# Patient Record
Sex: Male | Born: 1965 | Race: Black or African American | Hispanic: No | Marital: Single | Smoking: Never smoker
Health system: Southern US, Community
[De-identification: ages and names within clinical notes are randomized; demographics above are authoritative.]

## PROBLEM LIST (undated history)

## (undated) DIAGNOSIS — G459 Transient cerebral ischemic attack, unspecified: Secondary | ICD-10-CM

## (undated) DIAGNOSIS — I10 Essential (primary) hypertension: Secondary | ICD-10-CM

---

## 2003-02-06 ENCOUNTER — Ambulatory Visit (HOSPITAL_COMMUNITY): Admission: RE | Admit: 2003-02-06 | Discharge: 2003-02-06 | Payer: Self-pay | Admitting: Internal Medicine

## 2004-08-05 ENCOUNTER — Ambulatory Visit: Payer: Self-pay | Admitting: Internal Medicine

## 2004-08-19 ENCOUNTER — Ambulatory Visit: Payer: Self-pay | Admitting: Internal Medicine

## 2004-09-07 ENCOUNTER — Ambulatory Visit: Payer: Self-pay | Admitting: Internal Medicine

## 2004-11-04 ENCOUNTER — Ambulatory Visit: Payer: Self-pay | Admitting: Internal Medicine

## 2004-11-25 ENCOUNTER — Ambulatory Visit: Payer: Self-pay | Admitting: Internal Medicine

## 2005-03-09 ENCOUNTER — Ambulatory Visit: Payer: Self-pay

## 2005-03-09 ENCOUNTER — Ambulatory Visit: Payer: Self-pay | Admitting: Internal Medicine

## 2005-03-15 ENCOUNTER — Ambulatory Visit: Payer: Self-pay | Admitting: Internal Medicine

## 2005-04-08 ENCOUNTER — Encounter: Payer: Self-pay | Admitting: Internal Medicine

## 2005-05-16 ENCOUNTER — Ambulatory Visit: Payer: Self-pay | Admitting: Internal Medicine

## 2005-05-19 ENCOUNTER — Ambulatory Visit: Payer: Self-pay | Admitting: Internal Medicine

## 2005-05-29 ENCOUNTER — Emergency Department (HOSPITAL_COMMUNITY): Admission: AD | Admit: 2005-05-29 | Discharge: 2005-05-29 | Payer: Self-pay | Admitting: Family Medicine

## 2005-06-08 ENCOUNTER — Ambulatory Visit: Payer: Self-pay | Admitting: Internal Medicine

## 2005-06-20 ENCOUNTER — Encounter: Payer: Self-pay | Admitting: Internal Medicine

## 2005-06-20 ENCOUNTER — Emergency Department (HOSPITAL_COMMUNITY): Admission: EM | Admit: 2005-06-20 | Discharge: 2005-06-20 | Payer: Self-pay | Admitting: Emergency Medicine

## 2005-06-20 ENCOUNTER — Encounter: Payer: Self-pay | Admitting: Vascular Surgery

## 2005-06-23 ENCOUNTER — Encounter: Payer: Self-pay | Admitting: Internal Medicine

## 2005-06-28 ENCOUNTER — Encounter: Payer: Self-pay | Admitting: Internal Medicine

## 2005-07-15 ENCOUNTER — Encounter (INDEPENDENT_AMBULATORY_CARE_PROVIDER_SITE_OTHER): Payer: Self-pay | Admitting: Specialist

## 2005-07-15 ENCOUNTER — Ambulatory Visit (HOSPITAL_COMMUNITY): Admission: RE | Admit: 2005-07-15 | Discharge: 2005-07-15 | Payer: Self-pay | Admitting: Specialist

## 2006-06-09 DIAGNOSIS — I10 Essential (primary) hypertension: Secondary | ICD-10-CM

## 2006-12-08 ENCOUNTER — Encounter: Payer: Self-pay | Admitting: Family Medicine

## 2006-12-08 ENCOUNTER — Ambulatory Visit: Payer: Self-pay | Admitting: Internal Medicine

## 2006-12-08 DIAGNOSIS — Z8679 Personal history of other diseases of the circulatory system: Secondary | ICD-10-CM

## 2006-12-08 DIAGNOSIS — M1990 Unspecified osteoarthritis, unspecified site: Secondary | ICD-10-CM | POA: Insufficient documentation

## 2006-12-08 DIAGNOSIS — F528 Other sexual dysfunction not due to a substance or known physiological condition: Secondary | ICD-10-CM | POA: Insufficient documentation

## 2006-12-11 LAB — CONVERTED CEMR LAB
ALT: 32 units/L (ref 0–53)
AST: 27 units/L (ref 0–37)
BUN: 14 mg/dL (ref 6–23)
Basophils Absolute: 0 10*3/uL (ref 0.0–0.1)
Basophils Relative: 0.6 % (ref 0.0–1.0)
CO2: 29 meq/L (ref 19–32)
Calcium: 9.8 mg/dL (ref 8.4–10.5)
Chloride: 106 meq/L (ref 96–112)
Cholesterol: 198 mg/dL (ref 0–200)
Creatinine, Ser: 1.4 mg/dL (ref 0.4–1.5)
Eosinophils Absolute: 0.1 10*3/uL (ref 0.0–0.6)
Eosinophils Relative: 1.8 % (ref 0.0–5.0)
GFR calc Af Amer: 72 mL/min
GFR calc non Af Amer: 59 mL/min
Glucose, Bld: 106 mg/dL — ABNORMAL HIGH (ref 70–99)
HCT: 41.3 % (ref 39.0–52.0)
HDL: 33.6 mg/dL — ABNORMAL LOW (ref >39.0)
Hemoglobin: 13.9 g/dL (ref 13.0–17.0)
LDL Cholesterol: 136 mg/dL — ABNORMAL HIGH (ref 0–99)
Lymphocytes Relative: 29.1 % (ref 12.0–46.0)
MCHC: 33.5 g/dL (ref 30.0–36.0)
MCV: 78.9 fL (ref 78.0–100.0)
Monocytes Absolute: 0.4 10*3/uL (ref 0.2–0.7)
Monocytes Relative: 9 % (ref 3.0–11.0)
Neutro Abs: 2.9 10*3/uL (ref 1.4–7.7)
Neutrophils Relative %: 59.5 % (ref 43.0–77.0)
Platelets: 230 10*3/uL (ref 150–400)
Potassium: 4.2 meq/L (ref 3.5–5.1)
RBC: 5.24 M/uL (ref 4.22–5.81)
RDW: 13.1 % (ref 11.5–14.6)
Sodium: 142 meq/L (ref 135–145)
TSH: 0.95 microintl units/mL (ref 0.35–5.50)
Testosterone: 309.56 ng/dL — ABNORMAL LOW (ref 350.00–890)
Total CHOL/HDL Ratio: 5.9
Triglycerides: 143 mg/dL (ref 0–149)
VLDL: 29 mg/dL (ref 0–40)
WBC: 4.8 10*3/uL (ref 4.5–10.5)

## 2007-03-23 ENCOUNTER — Ambulatory Visit: Payer: Self-pay | Admitting: Internal Medicine

## 2007-04-12 ENCOUNTER — Ambulatory Visit: Payer: Self-pay | Admitting: Internal Medicine

## 2007-05-23 ENCOUNTER — Encounter (INDEPENDENT_AMBULATORY_CARE_PROVIDER_SITE_OTHER): Payer: Self-pay | Admitting: *Deleted

## 2007-06-21 ENCOUNTER — Telehealth (INDEPENDENT_AMBULATORY_CARE_PROVIDER_SITE_OTHER): Payer: Self-pay | Admitting: *Deleted

## 2007-06-25 ENCOUNTER — Ambulatory Visit: Payer: Self-pay | Admitting: Internal Medicine

## 2007-06-25 DIAGNOSIS — M549 Dorsalgia, unspecified: Secondary | ICD-10-CM | POA: Insufficient documentation

## 2007-07-11 ENCOUNTER — Ambulatory Visit: Payer: Self-pay | Admitting: Internal Medicine

## 2007-09-17 ENCOUNTER — Encounter (INDEPENDENT_AMBULATORY_CARE_PROVIDER_SITE_OTHER): Payer: Self-pay | Admitting: *Deleted

## 2008-04-22 ENCOUNTER — Telehealth: Payer: Self-pay | Admitting: Internal Medicine

## 2008-09-09 ENCOUNTER — Telehealth: Payer: Self-pay | Admitting: Internal Medicine

## 2008-09-10 ENCOUNTER — Encounter: Payer: Self-pay | Admitting: Internal Medicine

## 2008-09-12 ENCOUNTER — Ambulatory Visit: Payer: Self-pay | Admitting: Internal Medicine

## 2008-09-13 LAB — CONVERTED CEMR LAB
ALT: 79 units/L — ABNORMAL HIGH (ref 0–53)
AST: 39 units/L — ABNORMAL HIGH (ref 0–37)
BUN: 30 mg/dL — ABNORMAL HIGH (ref 6–23)
CO2: 19 meq/L (ref 19–32)
Calcium: 10.2 mg/dL (ref 8.4–10.5)
Chloride: 99 meq/L (ref 96–112)
Creatinine, Ser: 1.59 mg/dL — ABNORMAL HIGH (ref 0.40–1.50)
Glucose, Bld: 114 mg/dL — ABNORMAL HIGH (ref 70–99)
Potassium: 5.3 meq/L (ref 3.5–5.3)
Sodium: 136 meq/L (ref 135–145)
Uric Acid, Serum: 11.3 mg/dL — ABNORMAL HIGH (ref 4.0–7.8)

## 2008-09-16 ENCOUNTER — Telehealth: Payer: Self-pay | Admitting: Internal Medicine

## 2008-09-27 ENCOUNTER — Telehealth: Payer: Self-pay | Admitting: Internal Medicine

## 2008-10-02 ENCOUNTER — Ambulatory Visit: Payer: Self-pay | Admitting: Internal Medicine

## 2008-10-02 DIAGNOSIS — M109 Gout, unspecified: Secondary | ICD-10-CM | POA: Insufficient documentation

## 2008-10-09 ENCOUNTER — Encounter (INDEPENDENT_AMBULATORY_CARE_PROVIDER_SITE_OTHER): Payer: Self-pay | Admitting: *Deleted

## 2008-10-09 LAB — CONVERTED CEMR LAB
BUN: 17 mg/dL (ref 6–23)
CO2: 30 meq/L (ref 19–32)
Calcium: 9.9 mg/dL (ref 8.4–10.5)
Chloride: 104 meq/L (ref 96–112)
Creatinine, Ser: 1.3 mg/dL (ref 0.4–1.5)
GFR calc non Af Amer: 77.19 mL/min (ref >60)
Glucose, Bld: 121 mg/dL — ABNORMAL HIGH (ref 70–99)
Potassium: 4.8 meq/L (ref 3.5–5.1)
Sodium: 141 meq/L (ref 135–145)

## 2008-11-04 ENCOUNTER — Ambulatory Visit: Payer: Self-pay | Admitting: Family Medicine

## 2008-11-07 ENCOUNTER — Telehealth (INDEPENDENT_AMBULATORY_CARE_PROVIDER_SITE_OTHER): Payer: Self-pay | Admitting: *Deleted

## 2008-11-07 LAB — CONVERTED CEMR LAB: Uric Acid, Serum: 9.3 mg/dL — ABNORMAL HIGH (ref 4.0–7.8)

## 2008-11-17 ENCOUNTER — Encounter (INDEPENDENT_AMBULATORY_CARE_PROVIDER_SITE_OTHER): Payer: Self-pay | Admitting: *Deleted

## 2009-02-05 ENCOUNTER — Telehealth: Payer: Self-pay | Admitting: Internal Medicine

## 2009-02-23 ENCOUNTER — Telehealth (INDEPENDENT_AMBULATORY_CARE_PROVIDER_SITE_OTHER): Payer: Self-pay | Admitting: *Deleted

## 2009-05-11 ENCOUNTER — Telehealth (INDEPENDENT_AMBULATORY_CARE_PROVIDER_SITE_OTHER): Payer: Self-pay | Admitting: *Deleted

## 2009-05-12 ENCOUNTER — Encounter (INDEPENDENT_AMBULATORY_CARE_PROVIDER_SITE_OTHER): Payer: Self-pay | Admitting: *Deleted

## 2010-03-30 NOTE — Progress Notes (Signed)
Summary: dus ov  Phone Note Refill Request   Refills Requested: Medication #1:  AMLODIPINE BESYLATE 5 MG TABS 2 by mouth once daily  Medication #2:  FUROSEMIDE 20 MG TABS once daily  Medication #3:  COREG 25 MG TABS 1 by mouth two times a day  Medication #4:  LEVITRA 20 MG TABS 1/2 to 1 by mouth once daily as needed.  Method Requested: Fax to Local Pharmacy - [phone] Initial call taken by: Shary Decamp,  May 11, 2009 4:37 PM  Follow-up for Phone Call        Enrique Sack -- Pls call patient & schedule a follow up visit with Dr. Drue Novel -- I will refill meds after he schedules f/u visit. Shary Decamp  May 11, 2009 4:37 PM     Additional Follow-up for Phone Call Additional follow up Details #2::    MAILED A LETTER...Marland KitchenBarb Merino  May 12, 2009 9:32 AM  patient contact numbers are disconnected...Marland KitchenMarland KitchenBarb Merino  May 12, 2009 9:32 AM  Follow-up by: Barb Merino,  May 12, 2009 9:32 AM

## 2010-03-30 NOTE — Letter (Signed)
Summary: Primary Care Appointment Letter  [facility]  [address] [place], [place] [address]   Phone: [phone]  Fax: [phone]    05/12/2009 MRN: [id number]  [facility] [address] [place], [place]  [address]  Dear Mr. Carr,   Your Primary Care Physician Leadwood E. Paz MD has indicated that:    ___x____it is time to schedule an appointment.    _______you missed your appointment on______ and need to call and          reschedule.    _______you need to have lab work done.    _______you need to schedule an appointment discuss lab or test results.    _______you need to call to reschedule your appointment that is                       scheduled on _________.     Please call our office as soon as possible. Our phone number is [phone]________. Please press option 1. Our office is open 8a-12noon and 1p-5p, Monday through Friday.     Thank you,    [facility] Scheduler

## 2010-07-16 NOTE — Op Note (Signed)
NAMEJERL, Vincent Hogan              ACCOUNT NO.:  [id number]   MEDICAL RECORD NO.:  [id number]          PATIENT TYPE:  AMB   LOCATION:  DAY                          FACILITY:  [facility]   PHYSICIAN:  Jene Every, M.D.    DATE OF BIRTH:  1966-02-03   DATE OF PROCEDURE:  07/14/2005  DATE OF DISCHARGE:                                 OPERATIVE REPORT   PREOPERATIVE DIAGNOSES:  Medial meniscus tear, patellofemoral chondromalacia  left knee.   POSTOPERATIVE DIAGNOSES:  Medial meniscus tear, patellofemoral  chondromalacia left knee, gouty arthropathy.   PROCEDURE:  Left knee arthroplasty, chondroplasty of the patella, medial  femoral condyle, lateral femoral condyle, partial medial meniscectomy,  biopsy of tophaceous gouty deposit.   ANESTHESIA:  General.   ASSISTANT:  None.   BRIEF HISTORY:  This is a 45 year old with refractory knee pain, recurrent  effusions, partial relief from corticosteroid injection, MRI indicating  chondromalacia, persistent symptoms giving way. This is indicated for  debridement, evaluation of the meniscus and for a possible chondral flap  tear. The risks and benefits discussed including bleeding, infection, injury  to vascular structures, no change in symptoms, worsening symptoms, need for  repeat debridement in the future, anesthetic complications, etc.   DESCRIPTION OF PROCEDURE:  The patient in the supine position and after the  induction of adequate general anesthesia and 1 gram of Kefzol, the left  lower extremity was prepped and draped in the usual sterile fashion. A  lateral parapatellar portal and a superior medial parapatellar portal was  fashioned with a #11 blade after localization with an 18 gauge needle  sparing the medial meniscus. Under direct visualization, the medial  parapatellar portal was fashioned with a #11 blade after a localization with  an 18 gauge needle sparing the medial meniscus. Exuberant synovial fluid was  drained from the  knee. Examination revealed normal patellofemoral tracking.  There was some extensive grade 3 changes of the femoral sulcus and of the  patella. The gutters were unremarkable. Examination of the medial  compartment revealed spotty chalky deposits on the meniscus in the chondral  surface. A partial tear of the medial meniscus was noted. This is partially  excised with a straight basket rongeur and a biopsy of the tophaceous  material was obtained to evaluate for crystals. The ACL was unremarkable  with slight tearing of the PCL. The lateral compartment revealed some  chondral changes of the femoral condyle. The medial and lateral femoral  condyle chondroplasties were performed here due to some grade 3 changes.  Some minor change of the tibial plateau noted as well. The remainder of the  menisci was stable to propalpation without evidence of a tear on full  inspection utilizing a probe. Exam of the gutters spotty tophaceous material  noted here as well. The knee was copiously lavaged, palpated in the  posterior popliteal region, no loose cartilaginous debris. Residual was  noted. All instrumentation was removed. The portals were closed with 4-0  nylon simple sutures, 0.25% Marcaine with  epinephrine was infiltrated in the joint. The wound was dressed sterilely.  He was awoken without difficulty  and transported to the recovery room in  satisfactory condition.   The patient tolerated the procedure well with no complications.      Jene Every, M.D.  Electronically Signed     JB/MEDQ  D:  07/15/2005  T:  07/16/2005  Job:  [id number]

## 2015-10-12 ENCOUNTER — Emergency Department (HOSPITAL_COMMUNITY): Payer: BC Managed Care – PPO

## 2015-10-12 ENCOUNTER — Inpatient Hospital Stay (HOSPITAL_COMMUNITY)
Admission: EM | Admit: 2015-10-12 | Discharge: 2015-10-16 | DRG: 065 | Disposition: A | Discharge status: Home / Self Care | Payer: BC Managed Care – PPO | Attending: Family Medicine | Admitting: Family Medicine

## 2015-10-12 ENCOUNTER — Encounter (HOSPITAL_COMMUNITY): Payer: Self-pay | Admitting: Emergency Medicine

## 2015-10-12 DIAGNOSIS — G459 Transient cerebral ischemic attack, unspecified: Secondary | ICD-10-CM | POA: Diagnosis not present

## 2015-10-12 DIAGNOSIS — N189 Chronic kidney disease, unspecified: Secondary | ICD-10-CM | POA: Diagnosis present

## 2015-10-12 DIAGNOSIS — N179 Acute kidney failure, unspecified: Secondary | ICD-10-CM | POA: Diagnosis present

## 2015-10-12 DIAGNOSIS — I129 Hypertensive chronic kidney disease with stage 1 through stage 4 chronic kidney disease, or unspecified chronic kidney disease: Secondary | ICD-10-CM | POA: Diagnosis present

## 2015-10-12 DIAGNOSIS — I639 Cerebral infarction, unspecified: Secondary | ICD-10-CM | POA: Diagnosis present

## 2015-10-12 DIAGNOSIS — I6340 Cerebral infarction due to embolism of unspecified cerebral artery: Principal | ICD-10-CM | POA: Diagnosis present

## 2015-10-12 DIAGNOSIS — S8992XA Unspecified injury of left lower leg, initial encounter: Secondary | ICD-10-CM

## 2015-10-12 DIAGNOSIS — D62 Acute posthemorrhagic anemia: Secondary | ICD-10-CM

## 2015-10-12 DIAGNOSIS — E785 Hyperlipidemia, unspecified: Secondary | ICD-10-CM

## 2015-10-12 DIAGNOSIS — G8194 Hemiplegia, unspecified affecting left nondominant side: Secondary | ICD-10-CM | POA: Diagnosis present

## 2015-10-12 DIAGNOSIS — Z7982 Long term (current) use of aspirin: Secondary | ICD-10-CM

## 2015-10-12 DIAGNOSIS — I10 Essential (primary) hypertension: Secondary | ICD-10-CM | POA: Diagnosis present

## 2015-10-12 DIAGNOSIS — I69392 Facial weakness following cerebral infarction: Secondary | ICD-10-CM

## 2015-10-12 DIAGNOSIS — Z8673 Personal history of transient ischemic attack (TIA), and cerebral infarction without residual deficits: Secondary | ICD-10-CM

## 2015-10-12 HISTORY — DX: Essential (primary) hypertension: I10

## 2015-10-12 HISTORY — DX: Transient cerebral ischemic attack, unspecified: G45.9

## 2015-10-12 LAB — CBC
HCT: 37.6 % — ABNORMAL LOW (ref 39.0–52.0)
Hemoglobin: 13 g/dL (ref 13.0–17.0)
MCH: 26.5 pg (ref 26.0–34.0)
MCHC: 34.6 g/dL (ref 30.0–36.0)
MCV: 76.6 fL — ABNORMAL LOW (ref 78.0–100.0)
Platelets: 242 10*3/uL (ref 150–400)
RBC: 4.91 MIL/uL (ref 4.22–5.81)
RDW: 13.1 % (ref 11.5–15.5)
WBC: 4.1 10*3/uL (ref 4.0–10.5)

## 2015-10-12 LAB — BASIC METABOLIC PANEL
Anion gap: 9 (ref 5–15)
BUN: 38 mg/dL — ABNORMAL HIGH (ref 6–20)
CO2: 24 mmol/L (ref 22–32)
Calcium: 9.5 mg/dL (ref 8.9–10.3)
Chloride: 104 mmol/L (ref 101–111)
Creatinine, Ser: 1.94 mg/dL — ABNORMAL HIGH (ref 0.61–1.24)
GFR calc Af Amer: 45 mL/min — ABNORMAL LOW (ref >60)
GFR calc non Af Amer: 39 mL/min — ABNORMAL LOW (ref >60)
Glucose, Bld: 97 mg/dL (ref 65–99)
Potassium: 4.2 mmol/L (ref 3.5–5.1)
Sodium: 137 mmol/L (ref 135–145)

## 2015-10-12 NOTE — ED Notes (Addendum)
Pt denies any chest pain. Pt c/o swelling in lower extremities. Non-pitting edema noted upon assessment. Pt reports that he hasn't had his blood pressure medication since 10/08/2015. Pt states although he had generalized weakness this past weekend, he feels fine today.

## 2015-10-12 NOTE — ED Triage Notes (Addendum)
Pt c/o weakness over the weekend while moving daughter into college. Pt sts he was unable to help as much as he would've liked. Pt sts "My wife thinks I had a stroke."  Pt denies weakness at this time. Pt sts he slept well last night. Pt denies any symptoms at this time, sts he was just concerned because his wife was concerned. Denies weakness, N/V, dizziness, SOB, chest pain. Pt has equal strengths bilaterally, no facial droop, no slurred speech. Pt sts "My BP has been kind of high." Pt has been out of BP medication for three days. Denies neuro symptoms. A&Ox4 and ambulatory. Pt adds that he does have a history of TIAs and was placed on BP medication because of it.

## 2015-10-12 NOTE — ED Provider Notes (Signed)
[facility]-EMERGENCY DEPT Provider Note   CSN: [id number] Arrival date & time: 10/12/15  1659     History   Chief Complaint Chief Complaint  Patient presents with  . Hypertension    HPI  Patient's last time normal was over one week ago.  Earlie RavelingWesley D Giaimo is a 50 y.o. male. He presents here with his brother with whom he lives. Patient has a distant history of either stroke or TIA. He has a history of hypertension for which he takes Diovan, lisinopril, and HCTZ. He takes a daily aspirin. History is rather vague. He states that last week he had difficulty "doing stuff". He reports vague decrease in energy without focal neurological deficits such as numbness or weakness no difficulty speech or swallowing. His brother states that he was having such difficulty walking with a weak left lower leg a few days ago that he had to use a cane, and hold onto the wall. However this is better and he is walking better today. He had tried to help his ex-wife move his daughter into her college dorm and is unable to help reduce odor to "weakness". He cannot reiterate if this were focal or general. His brother became concerned and helped him here tonight  HPI  Past Medical History:  Diagnosis Date  . Hypertension   . TIA (transient ischemic attack)     Patient Active Problem List   Diagnosis Date Noted  . GOUT 10/02/2008  . BACK PAIN 06/25/2007  . ERECTILE DYSFUNCTION 12/08/2006  . OSTEOARTHRITIS 12/08/2006  . TRANSIENT ISCHEMIC ATTACKS, HX OF 12/08/2006  . HYPERTENSION 06/09/2006    History reviewed. No pertinent surgical history.     Home Medications    Prior to Admission medications   Medication Sig Start Date End Date Taking? Authorizing Provider  aspirin EC 81 MG tablet Take 81 mg by mouth daily.   Yes Historical Provider, MD  hydrochlorothiazide (HYDRODIURIL) 25 MG tablet Take 25 mg by mouth daily.   Yes Historical Provider, MD  lisinopril (PRINIVIL,ZESTRIL) 20 MG tablet Take 20 mg by  mouth daily.   Yes Historical Provider, MD  verapamil (CALAN) 40 MG tablet Take 40 mg by mouth daily.   Yes Historical Provider, MD    Family History No family history on file.  Social History Social History  Substance Use Topics  . Smoking status: Not on file  . Smokeless tobacco: Not on file  . Alcohol use Not on file     Allergies   Lisinopril   Review of Systems Review of Systems  Constitutional: Negative for appetite change, chills, diaphoresis, fatigue and fever.  HENT: Negative for mouth sores, sore throat and trouble swallowing.   Eyes: Negative for visual disturbance.  Respiratory: Negative for cough, chest tightness, shortness of breath and wheezing.   Cardiovascular: Negative for chest pain.  Gastrointestinal: Negative for abdominal distention, abdominal pain, diarrhea, nausea and vomiting.  Endocrine: Negative for polydipsia, polyphagia and polyuria.  Genitourinary: Negative for dysuria, frequency and hematuria.  Musculoskeletal: Negative for gait problem.  Skin: Negative for color change, pallor and rash.  Neurological: Positive for weakness. Negative for dizziness, syncope, light-headedness and headaches.  Hematological: Does not bruise/bleed easily.  Psychiatric/Behavioral: Negative for behavioral problems and confusion.     Physical Exam Updated Vital Signs BP 139/89 (BP Location: Left Arm)   Pulse 85   Temp 97.7 F (36.5 C) (Oral)   Resp 20   SpO2 99%   Physical Exam  Constitutional: He is oriented to person, place,  and time. He appears well-developed and well-nourished. No distress.  HENT:  Head: Normocephalic.  Eyes: Conjunctivae are normal. Pupils are equal, round, and reactive to light. No scleral icterus.  Neck: Normal range of motion. Neck supple. No thyromegaly present.  Cardiovascular: Normal rate and regular rhythm.  Exam reveals no gallop and no friction rub.   No murmur heard. Pulmonary/Chest: Effort normal and breath sounds normal.  No respiratory distress. He has no wheezes. He has no rales.  Abdominal: Soft. Bowel sounds are normal. He exhibits no distension. There is no tenderness. There is no rebound.  Musculoskeletal: Normal range of motion.  Neurological: He is alert and oriented to person, place, and time.  Subtle left facial weakness. No other cranial nerve deficits. No bruits. No pronator drift. No leg drift.  Skin: Skin is warm and dry. No rash noted.  Psychiatric: He has a normal mood and affect. His behavior is normal.     ED Treatments / Results  Labs (all labs ordered are listed, but only abnormal results are displayed) Labs Reviewed  BASIC METABOLIC PANEL - Abnormal; Notable for the following:       Result Value   BUN 38 (*)    Creatinine, Ser 1.94 (*)    GFR calc non Af Amer 39 (*)    GFR calc Af Amer 45 (*)    All other components within normal limits  CBC - Abnormal; Notable for the following:    HCT 37.6 (*)    MCV 76.6 (*)    All other components within normal limits    EKG  EKG Interpretation None       Radiology Ct Head Wo Contrast  Result Date: 10/12/2015 CLINICAL DATA:  Weakness over the weekend which has since resolved. Personal history of TIAs and hypertension. EXAM: CT HEAD WITHOUT CONTRAST TECHNIQUE: Contiguous axial images were obtained from the base of the skull through the vertex without intravenous contrast. COMPARISON:  MRI of the brain 02/06/2003 FINDINGS: Moderate diffuse age advanced white matter hypoattenuation is evident bilaterally. There is scattered hypoattenuation throughout the basal ganglia and thalami bilaterally. T2 changes extend into the corona radiata bilaterally. The insular ribbon is intact. No focal cortical defects are evident. The ventricles are proportionate to the degree of atrophy. No significant extra-axial fluid collection is present. Midline sagittal structures are within normal limits. White matter changes extend into the brainstem. The paranasal  sinuses and mastoid air cells are clear. IMPRESSION: 1. Moderate diffuse age advanced white matter hypoattenuation with extension into the brainstem. The patient had significant white matter changes 13 years ago. This represents a significant progression, likely related to chronic microvascular ischemia. MRI without contrast could be used for further characterization. 2. Multiple lacunar infarcts within the basal ganglia bilaterally are age indeterminate. 3. No definite acute intracranial abnormality. Electronically Signed   By: Marin Roberts M.D.   On: 10/12/2015 23:03    Procedures Procedures (including critical care time)  Medications Ordered in ED Medications - No data to display   Initial Impression / Assessment and Plan / ED Course  I have reviewed the triage vital signs and the nursing notes.  Pertinent labs & imaging results that were available during my care of the patient were reviewed by me and considered in my medical decision making (see chart for details).  Clinical Course    CT shows multiple age indeterminate thalamic infarcts. His only exam finding currently is a subtle left facial droop. I discussed the case with neurology. He  felt that MRI and stroke team evaluation were indicated. [facility]. I paged hospitalist regarding admission.  Final Clinical Impressions(s) / ED Diagnoses   Final diagnoses:  Cerebral infarction due to unspecified mechanism    New Prescriptions New Prescriptions   No medications on file     Rolland PorterMark Ladoris Lythgoe, MD 10/12/15 2356

## 2015-10-13 ENCOUNTER — Observation Stay (HOSPITAL_COMMUNITY): Payer: BC Managed Care – PPO

## 2015-10-13 ENCOUNTER — Encounter (HOSPITAL_COMMUNITY): Payer: Self-pay | Admitting: Internal Medicine

## 2015-10-13 DIAGNOSIS — I6340 Cerebral infarction due to embolism of unspecified cerebral artery: Secondary | ICD-10-CM

## 2015-10-13 DIAGNOSIS — G459 Transient cerebral ischemic attack, unspecified: Secondary | ICD-10-CM

## 2015-10-13 DIAGNOSIS — G8194 Hemiplegia, unspecified affecting left nondominant side: Secondary | ICD-10-CM | POA: Diagnosis present

## 2015-10-13 DIAGNOSIS — I129 Hypertensive chronic kidney disease with stage 1 through stage 4 chronic kidney disease, or unspecified chronic kidney disease: Secondary | ICD-10-CM | POA: Diagnosis present

## 2015-10-13 DIAGNOSIS — I10 Essential (primary) hypertension: Secondary | ICD-10-CM | POA: Diagnosis not present

## 2015-10-13 DIAGNOSIS — I69392 Facial weakness following cerebral infarction: Secondary | ICD-10-CM | POA: Diagnosis not present

## 2015-10-13 DIAGNOSIS — E785 Hyperlipidemia, unspecified: Secondary | ICD-10-CM

## 2015-10-13 DIAGNOSIS — D62 Acute posthemorrhagic anemia: Secondary | ICD-10-CM | POA: Diagnosis present

## 2015-10-13 DIAGNOSIS — I351 Nonrheumatic aortic (valve) insufficiency: Secondary | ICD-10-CM | POA: Diagnosis not present

## 2015-10-13 DIAGNOSIS — I639 Cerebral infarction, unspecified: Secondary | ICD-10-CM | POA: Diagnosis present

## 2015-10-13 DIAGNOSIS — N179 Acute kidney failure, unspecified: Secondary | ICD-10-CM | POA: Diagnosis present

## 2015-10-13 DIAGNOSIS — Z7982 Long term (current) use of aspirin: Secondary | ICD-10-CM | POA: Diagnosis not present

## 2015-10-13 DIAGNOSIS — R4789 Other speech disturbances: Secondary | ICD-10-CM | POA: Diagnosis not present

## 2015-10-13 DIAGNOSIS — N189 Chronic kidney disease, unspecified: Secondary | ICD-10-CM | POA: Diagnosis present

## 2015-10-13 LAB — URINALYSIS, ROUTINE W REFLEX MICROSCOPIC
Bilirubin Urine: NEGATIVE
Glucose, UA: NEGATIVE mg/dL
Ketones, ur: NEGATIVE mg/dL
Leukocytes, UA: NEGATIVE
Nitrite: NEGATIVE
Protein, ur: NEGATIVE mg/dL
Specific Gravity, Urine: 1.017 (ref 1.005–1.030)
pH: 5.5 (ref 5.0–8.0)

## 2015-10-13 LAB — URINE MICROSCOPIC-ADD ON

## 2015-10-13 LAB — RAPID URINE DRUG SCREEN, HOSP PERFORMED
Amphetamines: NOT DETECTED
Barbiturates: NOT DETECTED
Benzodiazepines: NOT DETECTED
Cocaine: NOT DETECTED
Opiates: NOT DETECTED
Tetrahydrocannabinol: NOT DETECTED

## 2015-10-13 LAB — LIPID PANEL
CHOLESTEROL: 157 mg/dL (ref 0–200)
HDL: 31 mg/dL — ABNORMAL LOW (ref >40)
LDL Cholesterol: 110 mg/dL — ABNORMAL HIGH (ref 0–99)
Total CHOL/HDL Ratio: 5.1 RATIO
Triglycerides: 82 mg/dL (ref <150)
VLDL: 16 mg/dL (ref 0–40)

## 2015-10-13 LAB — CBC
HCT: 36.5 % — ABNORMAL LOW (ref 39.0–52.0)
HEMOGLOBIN: 12 g/dL — AB (ref 13.0–17.0)
MCH: 26 pg (ref 26.0–34.0)
MCHC: 32.9 g/dL (ref 30.0–36.0)
MCV: 79.2 fL (ref 78.0–100.0)
Platelets: 228 10*3/uL (ref 150–400)
RBC: 4.61 MIL/uL (ref 4.22–5.81)
RDW: 13.1 % (ref 11.5–15.5)
WBC: 3.9 10*3/uL — ABNORMAL LOW (ref 4.0–10.5)

## 2015-10-13 LAB — BASIC METABOLIC PANEL
ANION GAP: 9 (ref 5–15)
BUN: 30 mg/dL — ABNORMAL HIGH (ref 6–20)
CALCIUM: 9.6 mg/dL (ref 8.9–10.3)
CO2: 24 mmol/L (ref 22–32)
Chloride: 104 mmol/L (ref 101–111)
Creatinine, Ser: 1.77 mg/dL — ABNORMAL HIGH (ref 0.61–1.24)
GFR, EST AFRICAN AMERICAN: 50 mL/min — AB (ref >60)
GFR, EST NON AFRICAN AMERICAN: 43 mL/min — AB (ref >60)
Glucose, Bld: 137 mg/dL — ABNORMAL HIGH (ref 65–99)
POTASSIUM: 4.6 mmol/L (ref 3.5–5.1)
Sodium: 137 mmol/L (ref 135–145)

## 2015-10-13 LAB — SODIUM, URINE, RANDOM: SODIUM UR: 85 mmol/L

## 2015-10-13 LAB — VAS US CAROTID
LCCAPDIAS: 23 cm/s
LEFT ECA DIAS: -9 cm/s
LEFT VERTEBRAL DIAS: 13 cm/s
LICADDIAS: -22 cm/s
LICAPDIAS: -9 cm/s
LICAPSYS: -36 cm/s
Left CCA dist dias: -18 cm/s
Left CCA dist sys: -63 cm/s
Left CCA prox sys: 106 cm/s
Left ICA dist sys: -51 cm/s
RIGHT ECA DIAS: -9 cm/s
RIGHT VERTEBRAL DIAS: -9 cm/s
Right CCA prox dias: -14 cm/s
Right CCA prox sys: -82 cm/s
Right cca dist sys: -40 cm/s

## 2015-10-13 LAB — TROPONIN I: Troponin I: 0.05 ng/mL (ref <0.03)

## 2015-10-13 LAB — CREATININE, URINE, RANDOM: Creatinine, Urine: 134.59 mg/dL

## 2015-10-13 MED ORDER — ASPIRIN 325 MG PO TABS
325.0000 mg | ORAL_TABLET | Freq: Every day | ORAL | Status: DC
  Administered 2015-10-13 – 2015-10-16 (×4): 325 mg via ORAL
  Filled 2015-10-13 (×4): qty 1

## 2015-10-13 MED ORDER — ACETAMINOPHEN 650 MG RE SUPP
650.0000 mg | RECTAL | Status: DC | PRN
Start: 2015-10-13 — End: 2015-10-16

## 2015-10-13 MED ORDER — STROKE: EARLY STAGES OF RECOVERY BOOK
Freq: Once | Status: AC
  Administered 2015-10-13: 05:00:00
  Filled 2015-10-13: qty 1

## 2015-10-13 MED ORDER — SODIUM CHLORIDE 0.9 % IV SOLN
INTRAVENOUS | Status: DC
  Administered 2015-10-13 – 2015-10-14 (×3): via INTRAVENOUS

## 2015-10-13 MED ORDER — ASPIRIN 300 MG RE SUPP
300.0000 mg | Freq: Every day | RECTAL | Status: DC
  Filled 2015-10-13: qty 1

## 2015-10-13 MED ORDER — ATORVASTATIN CALCIUM 80 MG PO TABS
80.0000 mg | ORAL_TABLET | Freq: Every day | ORAL | Status: DC
  Administered 2015-10-13 – 2015-10-15 (×3): 80 mg via ORAL
  Filled 2015-10-13 (×3): qty 1

## 2015-10-13 MED ORDER — VERAPAMIL HCL 40 MG PO TABS
40.0000 mg | ORAL_TABLET | Freq: Every day | ORAL | Status: DC
  Administered 2015-10-13 – 2015-10-16 (×4): 40 mg via ORAL
  Filled 2015-10-13 (×5): qty 1

## 2015-10-13 MED ORDER — ACETAMINOPHEN 325 MG PO TABS
650.0000 mg | ORAL_TABLET | ORAL | Status: DC | PRN
Start: 2015-10-13 — End: 2015-10-16
  Administered 2015-10-15: 650 mg via ORAL
  Filled 2015-10-13: qty 2

## 2015-10-13 NOTE — Progress Notes (Signed)
**  Preliminary report by tech**  Carotid duplex completed. Findings are consistent with a 1-39 percent stenosis involving the right internal carotid artery and the left internal carotid artery. The vertebral arteries demonstrate antegrade flow.  10/13/15 11:54 AM Olen CordialGreg Kaleen Rochette RVT

## 2015-10-13 NOTE — Evaluation (Signed)
Physical Therapy Evaluation Patient Details Name: Vincent Hogan MRN: [id number] DOB: 23-Aug-1965 Today's Date: 10/13/2015   History of Present Illness  Vincent PattyWesley D Spenceris a 50 y.o.malewith medical history significant ofTIAs in the past on poorly controlled hypertension. Pt admitted with L sided weakness and fatigue. 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  Clinical Impression  Pt admitted with the above and presents with deficits below. Pt tolerated ambulation with min guard well. However, pt had LOB x 3 to R side but was able to self correct. Pt with poor insight to deficits and safety awareness as demonstrated decreased knowledge of reason for LOB and report of feeling steady on his feet.  Lives at home with brother who is able to provide assistance as necessary. PT recommending outpatient PT to address balance and safety deficits for safe return home. However, there is a chance pt may improve and not require follow-up services. Continue acute follow.     Follow Up Recommendations Outpatient PT;Supervision for mobility/OOB    Equipment Recommendations  None recommended by PT    Recommendations for Other Services       Precautions / Restrictions Precautions Precautions: Fall Restrictions Weight Bearing Restrictions: No      Mobility  Bed Mobility Overal bed mobility: Needs Assistance Bed Mobility: Supine to Sit     Supine to sit: Supervision;HOB elevated     General bed mobility comments: in chair on arrival  Transfers Overall transfer level: Needs assistance Equipment used: None Transfers: Sit to/from Stand Sit to Stand: Min guard         General transfer comment: Pt with increased time sit>stand. Min guard for safety.  Ambulation/Gait Ambulation/Gait assistance: Min guard Ambulation Distance (Feet): 150 Feet Assistive device: None Gait Pattern/deviations: Step-through pattern;Staggering right;Drifts right/left (drifts to R )   Gait velocity  interpretation: at or above normal speed for age/gender (Tendency to speed up due to LOB.) General Gait Details: Pt with decreased L stance time due to L knee (previous injury). Pt reports feeling steady on feet, however had LOB x 3 into R wall. Pt able to self correct with min guard and verbal cueing. when asked, pt reported "the wall jumped out in front of me".  Stairs            Wheelchair Mobility    Modified Rankin (Stroke Patients Only) Modified Rankin (Stroke Patients Only) Pre-Morbid Rankin Score: No symptoms Modified Rankin: Moderately severe disability     Balance Overall balance assessment: Needs assistance Sitting-balance support: Bilateral upper extremity supported;Feet supported Sitting balance-Leahy Scale: Good     Standing balance support: No upper extremity supported;During functional activity Standing balance-Leahy Scale: Fair Standing balance comment: Decreased dynamic standing balance with LOB x3 during ambulation                             Pertinent Vitals/Pain Pain Assessment: No/denies pain Pain Score: 3  Pain Location: L knee Pain Descriptors / Indicators: Aching Pain Intervention(s): Monitored during session    Home Living Family/patient expects to be discharged to:: Private residence Living Arrangements: Other relatives (brother) Available Help at Discharge: Family Type of Home: House Home Access: Level entry     Home Layout: Two level Home Equipment: Cane - single point;Shower seat Additional Comments: works as a Runner, 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     Hand Dominance   Dominant Hand: Left    Extremity/Trunk Assessment   Upper Extremity Assessment: Overall WFL for tasks  assessed           Lower Extremity Assessment: Defer to PT evaluation      Cervical / Trunk Assessment: Normal  Communication   Communication: No difficulties  Cognition Arousal/Alertness: Awake/alert Behavior During Therapy: Impulsive Overall Cognitive Status: Impaired/Different from baseline Area of Impairment: Awareness;Safety/judgement     Memory: Decreased short-term memory   Safety/Judgement: Decreased awareness of safety;Decreased awareness of deficits Awareness: Anticipatory   General Comments: pt reports baseline and repeating same story twice during session. pt needed redirection to task several times. Pt reports all changes were family observations but he is fine. pt with no recall of MD visiting or dx.     General Comments General comments (skin integrity, edema, etc.): Pt with no deficits on visual screen and no reports of change in vision.     Exercises        Assessment/Plan    PT Assessment Patient needs continued PT services  PT Diagnosis Difficulty walking;Abnormality of gait   PT Problem List Decreased activity tolerance;Decreased balance;Decreased mobility;Decreased safety awareness;Decreased cognition;Decreased knowledge of use of DME  PT Treatment Interventions DME instruction;Gait training;Stair training;Functional mobility training;Therapeutic activities;Therapeutic exercise;Balance training;Neuromuscular re-education;Patient/family education   PT Goals (Current goals can be found in the Care Plan section) Acute Rehab PT Goals Patient Stated Goal: to go home and start work next week PT Goal Formulation: With patient Time For Goal Achievement: 10/20/15 Potential to Achieve Goals: Good    Frequency Min 4X/week   Barriers to discharge        Co-evaluation               End of Session Equipment Utilized During Treatment: Gait belt Activity Tolerance: Patient tolerated treatment well Patient left: in chair;with chair alarm set;with  family/visitor present;with call bell/phone within reach Nurse Communication: Mobility status         Time: [id number] PT Time Calculation (min) (ACUTE ONLY): 26 min   Charges:   PT Evaluation $PT Eval Moderate Complexity: 1 Procedure PT Treatments $Gait Training: 8-22 mins   PT G CodesDreama Saa:        Shaneya Taketa 10/13/2015, 4:23 PM  Park Literara A Abdikadir Fohl, SPT (student physical therapist) Acute Rehabilitation Services [phone]

## 2015-10-13 NOTE — Progress Notes (Signed)
Pt down to MRI

## 2015-10-13 NOTE — Consult Note (Signed)
Neurology Consult Note  Reason for Consultation: Possible stroke  Requesting provider: Rolland PorterMark James, MD  CC: Left-sided weakness  HPI: This is a 50-yo RH man who initially presented to [facility] ED at the urging of his family after experiencing weakness and difficulty walking. The patient is a limited historian who offers vague details about his presentation. History is obtained from the patient and from chart review as there are no family members present at the time of my visit.   The patient reports that he recently experienced some weakness but cannot tell me exactly when this happened. He states that he did not really appreciate it but rather his ex-wife mentioned that something was wrong when they were moving their daughter into her college dorm and he was apparently not helping as much as he normally would. He does not actually endorse any weakness, numbness, or other focal deficits. However, when I ask him directly if he was weak on one side he indicates that he had left-sided weakness. He did not mention difficulty walking until I asked him about it, after which he endorsed that he was unable to walk without the assistance of a cane for about one week. His brother had accompanied him to the ED yesterday afternoon and reported that over the past several days the patient's left leg was so weak that could not walk without using a cane and holding onto walls for balance. The patient now tells me that all symptoms have resolved and he is back to his normal baseline.   He states that he had a TIA in the past but cannot remember when this occurred or what symptoms he had with it. He was noted to have a mild left facial droop at the OSH but the patient indicated that this was chronic from a prior stroke. He also reported that he has not been taking his blood pressure meds because he ran out of them. He takes aspirin daily.   PMH:  1. HTN 2. TIA vs stroke--the patient has mentioned both but cannot  provide any specific details about when this occurred and what his symptoms were apart from a possible left facial droop 3. Back pain 4. Gout 5. Arthritis  6. Erectile dysfunction  PSH:  Denies  Family history: Family History  Problem Relation Age of Onset  . Dementia Mother   . Stroke Father   . Hypertension Father   . Hypertension Sister   . Hypertension Brother   . Stroke Other   . CAD Neg Hx   . Cancer Neg Hx     Social history:  He is divorced. He presently lives with his brother. 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 He denies any history of tobacco use. He denies alcohol or illicit drug use.    Current outpatient meds: Current Meds  Medication Sig  . aspirin EC 81 MG tablet Take 81 mg by mouth daily.  . hydrochlorothiazide (HYDRODIURIL) 25 MG tablet Take 25 mg by mouth daily.  Marland Kitchen. lisinopril (PRINIVIL,ZESTRIL) 20 MG tablet Take 20 mg by mouth daily.  . verapamil (CALAN) 40 MG tablet Take 40 mg by mouth daily.    Current inpatient meds:  Current Facility-Administered Medications  Medication Dose Route Frequency Provider Last Rate Last Dose  . 0.9 %  sodium chloride infusion   Intravenous Continuous Therisa DoyneAnastassia Doutova, MD 75 mL/hr at 10/13/15 0442    . acetaminophen (TYLENOL) tablet 650 mg  650 mg Oral Q4H PRN  Therisa Doyne, MD       Or  . acetaminophen (TYLENOL) suppository 650 mg  650 mg Rectal Q4H PRN Therisa Doyne, MD      . aspirin suppository 300 mg  300 mg Rectal Daily Therisa Doyne, MD       Or  . aspirin tablet 325 mg  325 mg Oral Daily Therisa Doyne, MD        Allergies: Allergies  Allergen Reactions  . Lisinopril     REACTION: cough    ROS: As per HPI. A full 14-point review of systems was performed and is otherwise unremarkable.   PE:  BP (!) 141/90 (BP Location: Right Arm)   Pulse 70   Temp 98.5 F (36.9 C) (Oral)   Resp 18   Ht 6\' 5"  (1.956 m)   Wt 107.3 kg (236 lb 8 oz)    SpO2 99%   BMI 28.04 kg/m   General: WDWN, no acute distress. He is sleeping soundly but rouses to voice. AAO x4. Speech clear, no dysarthria. No aphasia but he does have frequent word-finding pauses. Affect is flat with congruent mood. He appears indifferent to his recent symptoms.  HEENT: Normocephalic. Neck supple without LAD. MMM, OP clear. Dentition good. Sclerae anicteric. No conjunctival injection.  CV: Regular, no murmur. Carotid pulses full and symmetric, no bruits. Distal pulses 2+ and symmetric.  Lungs: CTAB.  Abdomen: Soft, non-distended, non-tender. Bowel sounds present x4.  Extremities: No C/C/E. Neuro:  CN: Pupils are equal and round. They are symmetrically reactive from 3-->2 mm. EOMI without nystagmus. No reported diplopia. Facial sensation is intact to light touch. There is slight flattening of the left nasolabial fold at rest with subtle left facial droop. Hearing is intact to conversational voice. Palate elevates symmetrically and uvula is midline. Voice is normal in tone, pitch and quality. Bilateral SCM and trapezii are 5/5. Tongue is midline with normal bulk and mobility.  Motor: Normal bulk, tone, and strength with the exception of 4+/5 grip, finger extensors, knee flexion, dorsiflexion and plantar flexion on the left. No tremor or other abnormal movements. No drift.  Sensation: Intact to light touch.  DTRs: 2+, brisker on the left than the right. Toes mute bilaterally. No pathologic reflexes.  Coordination: Finger-to-nose slow but without dysmetria.   Labs:  Lab Results  Component Value Date   WBC 4.1 10/12/2015   HGB 13.0 10/12/2015   HCT 37.6 (L) 10/12/2015   PLT 242 10/12/2015   GLUCOSE 97 10/12/2015   CHOL 198 12/08/2006   TRIG 143 12/08/2006   HDL 33.6 (L) 12/08/2006   LDLCALC 136 (H) 12/08/2006   ALT 79 (H) 09/12/2008   AST 39 (H) 09/12/2008   NA 137 10/12/2015   K 4.2 10/12/2015   CL 104 10/12/2015   CREATININE 1.94 (H) 10/12/2015   BUN 38 (H)  10/12/2015   CO2 24 10/12/2015   TSH 0.95 12/08/2006   UA notable for large Hgb, 0-5 epithelial cells, and rare bacteria Urine drug screen negative  Imaging:  I have personally and independently reviewed the CTH without contrast from 10/12/15. This shows extensive hypodensities in the bihemispheric white matter that is greater in the frontal regions and worse in the left cerebral hemisphere. This is consistent with marked chronic small vessel ischemic change. In addition, numerous focal areas of hypodensity are seen in the basal ganglia and thalami bilaterally suggestive of lacunar infarctions of indeterminate age. No obvious acute abnormality is seen.   Assessment and Plan:  1.  Possible acute ischemic stroke: History is concerning for a possible stroke in the right cerebral hemisphere involving either the MCA or ACA territory. He is a poor historian so it is difficult to be more precise. His CT scan shows extensive white matter abnormality and focal hypodensities suggestive of significant small vessel ischemic disease. Known risk factors for cerebrovascular disease in this patient include HTN and h/o TIA. Given his imaging and reports of early cerebrovascular disease, consideration must be given to unusual causes of stroke such as CADASIL and mitochondrial disorders as well as other leukoencephalopathies. Additional workup will be ordered to include MRI brain, MRA of the head, TTE, fasting lipids, and hemoglobin a1c. Further testing will be determined by results from these initial studies. Recommend antiplatelet therapy with aspirin 325 mg daily for secondary stroke prevention pending test results. He would also benefit from the addition of a statin and I will start atorvastatin 80 mg daily; goal LDL is less than 70. Ensure adequate glucose control. Allow permissive hypertension in the acute phase, treating only SBP greater than 220 mmHg and/or DBP greater than 110 mmHg. BP can be gradually lowered to  goal after over the next few days. Avoid fever and hyperglycemia as these can extend the infarct. Avoid hypotonic IVF to minimize exacerbation of post-stroke edema. Initiate rehab services. DVT prophylaxis as needed.   2. Left hemiparesis: He has subtle weakness on his current exam though he reports that he is at his normal baseline. It is not clear if he has some slight weakness at baseline since he did report a residual left facial droop from a prior stroke to one of his other providers. Follow. PT/OT as needed.   3. Word-finding difficulty: He had frequent pauses for word finding during his exam, the significance of which is not clear as I did awaken him from sleep at the time. This can be followed.   Thank you for the opportunity to participate in Mr. Labrecque's care. Please feel free to call with any questions or concerns. The stroke team will assume care of this patient from this point forward.

## 2015-10-13 NOTE — H&P (Signed)
Vincent Hogan ZOX:[id number] DOB: [birth year] DOA: 10/12/2015     PCP: Willow OraJose Paz, MD  Has not followed up Outpatient Specialists: None   Patient coming from:   home Lives  With family    Chief Complaint: Left sided weakness  HPI: Vincent Hogan is a 50 y.o. male with medical history significant of TIAs in the past on poorly controlled hypertension  CKD  Presented with one-sided weakness 3 days ago that seemed to have resolved but over the weekend patient was trying to move his daughter into college and states he couldn't have helped as much as he wanted to his family was concerned for him having a stroke because he was dragging his left leg at the hold onto the wall and use a cane currently symptoms have resolved. He denies any nausea vomiting no shortness of breath no chest pain he may have had a mild facial droop. Denies any slurred speech he hasn't been taking his medications as prescribed his blood pressure has been running high. He remembers having history of TIAs but unsure if she ever had a stroke. He's been on aspirin 81 mg a day for history of TIAs  Patient has history of hypertension but has run out of his medications he takes Diovan, lisinopril, and HCTZ. He reports since he hasn't taken his blood pressure medication he have had some peripheral edema Reports no trouble with memory.   IN ER: Afebrile heart rate 85 blood pressure 155/103 on arrival Sodium 137 BUN 38 potassium 4.2 cranial 1.94 which is up from baseline of 1.5   2 years ago WBC 4.1 hemoglobin 13 CT head showed moderate diffuse age advanced white matter hypoattenuation with extension to brainstem chronic microvascular ischemia multiple lacunar infarcts within the basal ganglia bilaterally undetermined age  ER provided discussed case with neurology on-call who request admission to [facility] neurology will follow up  Hospitalist was called for admission for TIA  Review of Systems:    Pertinent positives  include: fatigue, left side weakness gait abnormality,  Constitutional:  No weight loss, night sweats, Fevers, chills,  weight loss  HEENT:  No headaches, Difficulty swallowing,Tooth/dental problems,Sore throat,  No sneezing, itching, ear ache, nasal congestion, post nasal drip,  Cardio-vascular:  No chest pain, Orthopnea, PND, anasarca, dizziness, palpitations.no Bilateral lower extremity swelling  GI:  No heartburn, indigestion, abdominal pain, nausea, vomiting, diarrhea, change in bowel habits, loss of appetite, melena, blood in stool, hematemesis Resp:  no shortness of breath at rest. No dyspnea on exertion, No excess mucus, no productive cough, No non-productive cough, No coughing up of blood.No change in color of mucus.No wheezing. Skin:  no rash or lesions. No jaundice GU:  no dysuria, change in color of urine, no urgency or frequency. No straining to urinate.  No flank pain.  Musculoskeletal:  No joint pain or no joint swelling. No decreased range of motion. No back pain.  Psych:  No change in mood or affect. No depression or anxiety. No memory loss.  Neuro:  no double vision, no no slurred speech, no confusion  As per HPI otherwise 10 point review of systems negative.   Past Medical History: Past Medical History:  Diagnosis Date  . Hypertension   . TIA (transient ischemic attack)    History reviewed. No pertinent surgical history.   Social History:  Ambulatory   independently Up until now but now needs cane    reports that he has never smoked. He has never used smokeless  tobacco. He reports that he does not drink alcohol or use drugs.  Allergies:   Allergies  Allergen Reactions  . Lisinopril     REACTION: cough       Family History:  Family History  Problem Relation Age of Onset  . Dementia Mother   . Stroke Father   . Hypertension Father   . Hypertension Sister   . Hypertension Brother   . Stroke Other   . CAD Neg Hx   . Cancer Neg Hx      Medications: Prior to Admission medications   Medication Sig Start Date End Date Taking? Authorizing Provider  aspirin EC 81 MG tablet Take 81 mg by mouth daily.   Yes Historical Provider, MD  hydrochlorothiazide (HYDRODIURIL) 25 MG tablet Take 25 mg by mouth daily.   Yes Historical Provider, MD  lisinopril (PRINIVIL,ZESTRIL) 20 MG tablet Take 20 mg by mouth daily.   Yes Historical Provider, MD  verapamil (CALAN) 40 MG tablet Take 40 mg by mouth daily.   Yes Historical Provider, MD    Physical Exam: Patient Vitals for the past 24 hrs:  BP Temp Temp src Pulse Resp SpO2  10/13/15 0000 136/95 - - 71 14 99 %  10/12/15 2137 139/89 97.7 F (36.5 C) Oral 85 20 99 %  10/12/15 1706 (!) 153/103 98.9 F (37.2 C) Oral 85 16 99 %    1. General:  in No Acute distress 2. Psychological: Alert and  Oriented 3. Head/ENT:    Dry Mucous Membranes                          Head Non traumatic, neck supple                          Normal   Dentition 4. SKIN: decreased Skin turgor,  Skin clean Dry and intact no rash 5. Heart: Regular rate and rhythm   Murmur, Rub or gallop 6. Lungs:  Clear to auscultation bilaterally, no wheezes or crackles   7. Abdomen: Soft,   non-tender, Non distended 8. Lower extremities: no clubbing, cyanosis, or edema 9. Neurologically   strength 5 out of 5 in all 4 extremities cranial nerves II through XII intact Except for left facial droop which patient states is chronic since his past  CVA 10. MSK: Normal range of motion   body mass index is unknown because there is no height or weight on file.  Labs on Admission:   Labs on Admission: I have personally reviewed following labs and imaging studies  CBC:  Recent Labs Lab 10/12/15 1748  WBC 4.1  HGB 13.0  HCT 37.6*  MCV 76.6*  PLT 242   Basic Metabolic Panel:  Recent Labs Lab 10/12/15 1748  NA 137  K 4.2  CL 104  CO2 24  GLUCOSE 97  BUN 38*  CREATININE 1.94*  CALCIUM 9.5   GFR: CrCl cannot be  calculated (Unknown ideal weight.). Liver Function Tests: No results for input(s): AST, ALT, ALKPHOS, BILITOT, PROT, ALBUMIN in the last 168 hours. No results for input(s): LIPASE, AMYLASE in the last 168 hours. No results for input(s): AMMONIA in the last 168 hours. Coagulation Profile: No results for input(s): INR, PROTIME in the last 168 hours. Cardiac Enzymes: No results for input(s): CKTOTAL, CKMB, CKMBINDEX, TROPONINI in the last 168 hours. BNP (last 3 results) No results for input(s): PROBNP in the last 8760 hours. HbA1C: No  results for input(s): HGBA1C in the last 72 hours. CBG: No results for input(s): GLUCAP in the last 168 hours. Lipid Profile: No results for input(s): CHOL, HDL, LDLCALC, TRIG, CHOLHDL, LDLDIRECT in the last 72 hours. Thyroid Function Tests: No results for input(s): TSH, T4TOTAL, FREET4, T3FREE, THYROIDAB in the last 72 hours. Anemia Panel: No results for input(s): VITAMINB12, FOLATE, FERRITIN, TIBC, IRON, RETICCTPCT in the last 72 hours. Urine analysis: No results found for: COLORURINE, APPEARANCEUR, LABSPEC, PHURINE, GLUCOSEU, HGBUR, BILIRUBINUR, KETONESUR, PROTEINUR, UROBILINOGEN, NITRITE, LEUKOCYTESUR Sepsis Labs: @LABRCNTIP (procalcitonin:4,lacticidven:4) )No results found for this or any previous visit (from the past 240 hour(s)).    UA    ordered  No results found for: HGBA1C  CrCl cannot be calculated (Unknown ideal weight.).  BNP (last 3 results) No results for input(s): PROBNP in the last 8760 hours.   ECG REPORT  Independently reviewed Ordered  There were no vitals filed for this visit.   Cultures: No results found for: SDES, SPECREQUEST, CULT, REPTSTATUS   Radiological Exams on Admission: Ct Head Wo Contrast  Result Date: 10/12/2015 CLINICAL DATA:  Weakness over the weekend which has since resolved. Personal history of TIAs and hypertension. EXAM: CT HEAD WITHOUT CONTRAST TECHNIQUE: Contiguous axial images were obtained from the  base of the skull through the vertex without intravenous contrast. COMPARISON:  MRI of the brain 02/06/2003 FINDINGS: Moderate diffuse age advanced white matter hypoattenuation is evident bilaterally. There is scattered hypoattenuation throughout the basal ganglia and thalami bilaterally. T2 changes extend into the corona radiata bilaterally. The insular ribbon is intact. No focal cortical defects are evident. The ventricles are proportionate to the degree of atrophy. No significant extra-axial fluid collection is present. Midline sagittal structures are within normal limits. White matter changes extend into the brainstem. The paranasal sinuses and mastoid air cells are clear. IMPRESSION: 1. Moderate diffuse age advanced white matter hypoattenuation with extension into the brainstem. The patient had significant white matter changes 13 years ago. This represents a significant progression, likely related to chronic microvascular ischemia. MRI without contrast could be used for further characterization. 2. Multiple lacunar infarcts within the basal ganglia bilaterally are age indeterminate. 3. No definite acute intracranial abnormality. Electronically Signed   By: Marin Robertshristopher  Mattern M.D.   On: 10/12/2015 23:03    Chart has been reviewed    Assessment/Plan  50 y.o. male with medical history significant of TIAs in the past on poorly controlled hypertension  CKD being admitted for left side weakness which currently resolved CT scan showing multiple lacunar infarcts been evaluated for TIA versus CVA   Present on Admission: . TIA (transient ischemic attack) -  - will admit based on TIA/CVA protocol, await results of MRA/MRI, Carotid Doppler and Echo, obtain cardiac enzymes,  ECG,   Lipid panel, TSH. Order PT/OT evaluation. Will make sure patient is on antiplatelet agent.   Neurology consulted.     . Essential hypertension - permissive hypertension for tonight Chronic kidney disease creatinine up from  baseline. Will check urine electrolytes and rehydrate Acute on chronic renal failure unsure phthisis progression versus acute change. Will rehydrate check urine electrolytes and renal ultrasound  Other plan as per orders.  DVT prophylaxis:  SCD    Code Status:  FULL CODE  as per patient   Family Communication:   Family   at  Bedside  plan of care was discussed with  Tilda FrancoBrother robert Fagerstrom [phone]  Disposition Plan: To home once workup is complete and patient is stable   Consults  called: Dr. Roxy Manns  Admission status:  obs   Level of care    tele       I have spent a total of 46 min on this admission    Vincent Hogan 10/13/2015, 12:51 AM    [facility]  Pager [phone]   after 2 AM please page floor coverage PA If 7AM-7PM, please contact the day team taking care of the patient  [email or web address]  Password [email or web address]

## 2015-10-13 NOTE — Progress Notes (Signed)
I have seen and assessed patient and agree with Dr.Doutova's assessment and plan. Patient is a 50 year old gentleman with prior history of TIA, poorly controlled hypertension who presented to the ED with a three-day history of left-sided weakness. CT head with moderate diffuse age advanced white matter hyper attenuation with extension into the brainstem, multiple lacunar infarcts within the basal ganglia bilaterally. MRI of the head done with scattered small acute infarcts in the left greater than the right cerebral white matter and cortex. Small acute infarct in the left pons. Pattern suggests central embolic disease. MRA was negative. Patient on full dose aspirin for secondary stroke prevention. Neurology has been consulted and following. Stroke workup underway. Patient will likely require TEE.

## 2015-10-13 NOTE — Progress Notes (Signed)
Pt back to floor.

## 2015-10-13 NOTE — Progress Notes (Signed)
STROKE TEAM PROGRESS NOTE   HISTORY OF PRESENT ILLNESS (per record) Vincent Hogan is a 50-yo RH male who initially presented to [facility] ED at the urging of his family after experiencing weakness and difficulty walking. The patient is a limited historian who offers vague details about his presentation. History is obtained from the patient and from chart review as there are no family members present at the time of his visit.   The patient reports that he recently experienced some weakness but cannot tell exactly when this happened. He states that he did not really appreciate it but rather his ex-wife mentioned that something was wrong when they were moving their daughter into her college dorm and he was apparently not helping as much as he normally would. He does not actually endorse any weakness, numbness, or other focal deficits. However, when asked directly if he was weak on one side he indicates that he had left-sided weakness. He did not mention difficulty walking until asked him about it, after which he endorsed that he was unable to walk without the assistance of a cane for about one week. His brother had accompanied him to the ED yesterday afternoon and reported that over the past several days the patient's left leg was so weak that could not walk without using a cane and holding onto walls for balance. The patient now tells me that all symptoms have resolved and he is back to his normal baseline.   He states that he had a TIA in the past but cannot remember when this occurred or what symptoms he had with it. He was noted to have a mild left facial droop at the OSH but the patient indicated that this was chronic from a prior stroke. He also reported that he has not been taking his blood pressure meds because he ran out of them. He takes aspirin daily.   Patient was not considered for IV t-PA secondary to arriving outside the window. He was admitted for further evaluation and  treatment.   SUBJECTIVE (INTERVAL HISTORY) Brother at bedside. Pt stated that his symptoms all resolved now. He had left UE and LE weakness for the last week and now resolved. He stated that he had strokes in the past x 2 but he can not elaborate details. He has left facial droop which he said it was old from previous stroke.    OBJECTIVE Temp:  [97.7 F (36.5 C)-98.9 F (37.2 C)] 98.1 F (36.7 C) (08/15 0843) Pulse Rate:  [66-85] 66 (08/15 1010) Cardiac Rhythm: Heart block (08/15 0700) Resp:  [14-20] 17 (08/15 1010) BP: (136-153)/(89-103) 142/91 (08/15 1010) SpO2:  [98 %-100 %] 100 % (08/15 1010) Weight:  [107.3 kg (236 lb 8 oz)] 107.3 kg (236 lb 8 oz) (08/15 0316)  CBC:  Recent Labs Lab 10/12/15 1748 10/13/15 1049  WBC 4.1 3.9*  HGB 13.0 12.0*  HCT 37.6* 36.5*  MCV 76.6* 79.2  PLT 242 228    Basic Metabolic Panel:  Recent Labs Lab 10/12/15 1748  NA 137  K 4.2  CL 104  CO2 24  GLUCOSE 97  BUN 38*  CREATININE 1.94*  CALCIUM 9.5    Lipid Panel:    Component Value Date/Time   CHOL 157 10/13/2015 0516   TRIG 82 10/13/2015 0516   HDL 31 (L) 10/13/2015 0516   CHOLHDL 5.1 10/13/2015 0516   VLDL 16 10/13/2015 0516   LDLCALC 110 (H) 10/13/2015 0516   HgbA1c: No results found for: HGBA1C  Urine Drug Screen:    Component Value Date/Time   LABOPIA NONE DETECTED 10/13/2015 0030   COCAINSCRNUR NONE DETECTED 10/13/2015 0030   LABBENZ NONE DETECTED 10/13/2015 0030   AMPHETMU NONE DETECTED 10/13/2015 0030   THCU NONE DETECTED 10/13/2015 0030   LABBARB NONE DETECTED 10/13/2015 0030      IMAGING I have personally reviewed the radiological images below and agree with the radiology interpretations.  Ct Head Wo Contrast 10/12/2015 1. Moderate diffuse age advanced white matter hypoattenuation with extension into the brainstem. The patient had significant white matter changes 13 years ago. This represents a significant progression, likely related to chronic microvascular  ischemia. MRI without contrast could be used for further characterization. 2. Multiple lacunar infarcts within the basal ganglia bilaterally are age indeterminate. 3. No definite acute intracranial abnormality.  [name] Mr Maxine Glenn Head/brain Wo Cm 10/13/2015 1. Scattered small acute infarcts in the left more than right cerebral white matter and cortex. Small acute infarcts in the left pons. Pattern suggests central embolic disease. 2. Severe chronic microvascular disease. 3. Negative intracranial MRA.   Dg Chest 2 View 10/13/2015 No acute cardiopulmonary process seen.   US Renal 10/13/2015 No evidence of obstruction, cortical atrophy, or increased cortical echotexture. Simple appearing cysts bilaterally.  Carotid Doppler There is 1-39% bilateral ICA stenosis. Vertebral artery flow is antegrade.    2-D echocardiogram - pending  TCD bubble study - pending  TCD MES - pending  LE venous doppler - pending  TEE pending   PHYSICAL EXAM General - Well nourished, well developed, in no apparent distress.  Ophthalmologic - Sharp disc margins OU.   Cardiovascular - Regular rate and rhythm.  Mental Status -  Level of arousal and orientation to time, place, and person were intact. Language including expression, naming, repetition, comprehension was assessed and found intact. Mildly slow mental processing.  Cranial Nerves II - XII - II - Visual field intact OU. III, IV, VI - Extraocular movements intact. V - Facial sensation intact bilaterally. VII - Facial movement intact bilaterally. VIII - Hearing & vestibular intact bilaterally. X - Palate elevates symmetrically. XI - Chin turning & shoulder shrug intact bilaterally. XII - Tongue protrusion intact.  Motor Strength - The patient's strength was normal in all extremities and pronator drift was absent.  Bulk was normal and fasciculations were absent.   Motor Tone - Muscle tone was assessed at the neck and appendages and was  normal.  Reflexes - The patient's reflexes were 1+ in all extremities and he had no pathological reflexes.  Sensory - Light touch, temperature/pinprick were assessed and were symmetrical.    Coordination - The patient had normal movements in the hands with no ataxia or dysmetria.  Tremor was absent.  Gait and Station - deferred.   ASSESSMENT/PLAN Mr. RABON SCHOLLE is a 50 y.o. male with history of TIAs and poorly controlled blood pressure presenting with L sided weakness. He did not receive IV t-PA due to delay in arrival.   Stroke:  scattered L>R cortical and subcortical cerebral infarct and mild left pontine infarcts, infarcts embolic secondary to unknown source  Resultant  Deficit resolved  MRI  Scattered small L > R cortical and subcortical cerebral infarcts, small left pontine infarcts  MRA  negative  Carotid Doppler no significant stenosis  2D Echo  Pending  TCD bubble study pending  TCD MES pending  LE venous doppler pending  TEE to look for embolic source. Arranged with [facility] for tomorrow.  If positive for PFO (patent foramen ovale), check bilateral lower extremity venous dopplers to rule out DVT as possible source of stroke. (I have made patient NPO after midnight tonight).   If TEE negative, a [facility] electrophysiologist will consult and consider placement of an implantable loop recorder to evaluate for atrial fibrillation as etiology of stroke. This has been explained to patient/family by Dr. Roda ShuttersXu and they are agreeable.   LDL 110  HgbA1c pending  SCDs for VTE prophylaxis  Diet Heart Room service appropriate? Yes; Fluid consistency: Thin  aspirin 81 mg daily prior to admission, now on aspirin 325 mg daily. Continue ASA on discharge.  Patient counseled to be compliant with his antithrombotic medications  Ongoing aggressive stroke risk factor management  Therapy recommendations:   pending  Disposition:  pending  Hypertension  Stable Permissive hypertension (OK if < 220/120) but gradually normalize in 5-7 days Long-term BP goal normotensive  Hyperlipidemia  Home meds:  No statin  LDL 110, goal < 70  Add lipitor 80mg   Continue statin at discharge  Other Stroke Risk Factors  Overweight, Body mass index is 28.04 kg/m., recommend weight loss, diet and exercise as appropriate   Hx stroke vs TIA - patient cannot provide any specific details about when this occurred and where his symptoms were except for a possible left facial droop  Family hx stroke (father, other)  Other Active Problems  Acute on Chronic Kidney disease -  Cre 1.94  Hospital day # 0  Marvel PlanJindong Consetta Cosner, MD PhD Stroke Neurology 10/13/2015 4:43 PM   To contact Stroke Continuity provider, please refer to [email or web address]. After hours, contact General Neurology

## 2015-10-13 NOTE — Progress Notes (Signed)
Pt to vascular.

## 2015-10-13 NOTE — Progress Notes (Signed)
Pt back from vascular.

## 2015-10-13 NOTE — Evaluation (Signed)
Occupational Therapy Evaluation Patient Details Name: Vincent Hogan MRN: [id number] DOB: 08-01-1965 Today's Date: 10/13/2015    History of Present Illness Vincent Hogan a 49 y.o.malewith medical history significant ofTIAs in the past on poorly controlled hypertension. Pt admitted with L sided weakness and fatigue. 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   Clinical Impression   Pt lacks awareness to any deficits and reports he is near baseline. Brother ( also roommate ) present and reports concern with patients changes. Pt reports his childs mother called his brother because she told I had a stroke but that's just want she thought. Pt could greatly benefit from CIR>      Follow Up Recommendations  CIR    Equipment Recommendations  Other (comment) (defer)    Recommendations for Other Services Rehab consult     Precautions / Restrictions Precautions Precautions: Fall Restrictions Weight Bearing Restrictions: No      Mobility Bed Mobility Overal bed mobility: Needs Assistance Bed Mobility: Supine to Sit     Supine to sit: Supervision;HOB elevated     General bed mobility comments: in chair on arrival  Transfers Overall transfer level: Needs assistance Equipment used: None Transfers: Sit to/from Stand Sit to Stand: Min guard         General transfer comment: Pt with increased time sit>stand. Min guard for safety.    Balance Overall balance assessment: Needs assistance Sitting-balance support: Bilateral upper extremity supported;Feet supported Sitting balance-Leahy Scale: Good     Standing balance support: No upper extremity supported;During functional activity Standing balance-Leahy Scale: Fair Standing balance comment: Decreased dynamic standing balance with LOB x3 during ambulation                            ADL Overall ADL's : Needs assistance/impaired   Eating/Feeding Details (indicate cue type and reason): pt  currently holding a tooth . Tooth placed in pink container in room. Brother asked to bring oral care glue that patient is requesting to help keep tooth in proper placement Grooming: Wash/dry hands;Set up                   Toilet Transfer: Min guard       Tub/ Shower Transfer: Minimal Radiation protection practitionerassistance Tub/Shower Transfer Details (indicate cue type and reason): pt needed incr time to complete sequence and unsteady with transfer. pt holding onto environmental supports   General ADL Comments: pt asked to close eyes for 10 seconds with great sway and reaching for environmental support     Vision Vision Assessment?: Yes Eye Alignment: Impaired (comment) Ocular Range of Motion: Within Functional Limits Convergence: Impaired (comment) Additional Comments: Pt R eye with decr lateral movement and nystagmus noted. pt demonstrates decr lateral movement with nystagmus that could indicate possible INO but pt denies visual changes or diplopia. pt with very clear misalignment of eyes but declines any changes.    Perception     Praxis      Pertinent Vitals/Pain Pain Assessment: No/denies pain Pain Score: 3  Pain Location: L knee Pain Descriptors / Indicators: Aching Pain Intervention(s): Monitored during session     Hand Dominance Left   Extremity/Trunk Assessment Upper Extremity Assessment Upper Extremity Assessment: Overall WFL for tasks assessed   Lower Extremity Assessment Lower Extremity Assessment: Defer to PT evaluation   Cervical / Trunk Assessment Cervical / Trunk Assessment: Normal   Communication Communication Communication: No difficulties   Cognition Arousal/Alertness: Awake/alert Behavior  During Therapy: Impulsive Overall Cognitive Status: Impaired/Different from baseline Area of Impairment: Awareness;Safety/judgement     Memory: Decreased short-term memory   Safety/Judgement: Decreased awareness of safety;Decreased awareness of deficits Awareness:  Anticipatory   General Comments: pt reports baseline and repeating same story twice during session. pt needed redirection to task several times. Pt reports all changes were family observations but he is fine. pt with no recall of MD visiting or dx.    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    OT Diagnosis: Generalized weakness;Cognitive deficits   OT Problem List: Decreased strength;Decreased activity tolerance;Impaired balance (sitting and/or standing);Decreased cognition;Decreased safety awareness;Decreased knowledge of use of DME or AE;Decreased knowledge of precautions;Impaired vision/perception   OT Treatment/Interventions: Self-care/ADL training;Therapeutic exercise;DME and/or AE instruction;Neuromuscular education;Therapeutic activities;Cognitive remediation/compensation;Visual/perceptual remediation/compensation;Patient/family education;Balance training    OT Goals(Current goals can be found in the care plan section) Acute Rehab OT Goals Patient Stated Goal: to go home and start work next week OT Goal Formulation: With patient/family Time For Goal  Achievement: 10/27/15 Potential to Achieve Goals: Good ADL Goals Pt Will Transfer to Toilet: with supervision;regular height toilet Pt Will Perform Tub/Shower Transfer: Shower transfer;with supervision;ambulating Additional ADL Goal #1: Pt will complete vision task with less than 2 errors Additional ADL Goal #2: Pt will complete mOCa with score >19   OT Frequency: Min 2X/week   Barriers to D/C:            Co-evaluation              End of Session Equipment Utilized During Treatment: Gait belt Nurse Communication: Mobility status;Precautions  Activity Tolerance: Patient tolerated treatment well Patient left: in chair;with call bell/phone within reach;with chair alarm set;with family/visitor present   Time: 1500-1515 OT Time Calculation (min): 15 min Charges:  OT General Charges $OT Visit: 1 Procedure OT Evaluation $OT Eval Moderate Complexity: 1 Procedure G-Codes: OT G-codes **NOT FOR INPATIENT CLASS** Functional Assessment Tool Used: clinical judgement Functional Limitation: Self care Self Care Current Status (W0981(G8987): At least 40 percent but less than 60 percent impaired, limited or restricted Self Care Goal Status (X9147(G8988): At least 40 percent but less than 60 percent impaired, limited or restricted  Boone Hogan, Vincent Hogan 10/13/2015, 4:16 PM  Mateo FlowJones, Brynn   OTR/L Pager: [phone] Office: [phone] .

## 2015-10-13 NOTE — Care Management Note (Signed)
Case Management Note  Patient Details  Name: Vincent Hogan MRN: [id number] Date of Birth: 1965/07/14  Subjective/Objective:  Pt admitted with CVA. He is from home with relatives.                   Action/Plan: Awaiting PT/OT recommendations. CM following for d/c needs.   Expected Discharge Date:                  Expected Discharge Plan:     In-House Referral:     Discharge planning Services     Post Acute Care Choice:    Choice offered to:     DME Arranged:    DME Agency:     HH Arranged:    HH Agency:     Status of Service:  In process, will continue to follow  If discussed at Long Length of Stay Meetings, dates discussed:    Additional Comments:  Vincent BaloKelli F Bronda Alfred, RN 10/13/2015, 12:33 PM

## 2015-10-13 NOTE — Evaluation (Signed)
Speech Language Pathology Evaluation Patient Details Name: Vincent Hogan MRN: [id number] DOB: 1965-07-30 Today's Date: 10/13/2015 Time: [id number] SLP Time Calculation (min) (ACUTE ONLY): 28 min  Problem List:  Patient Active Problem List   Diagnosis Date Noted  . TIA (transient ischemic attack) 10/13/2015  . CVA (cerebral infarction) 10/13/2015  . Chronic kidney disease 10/13/2015  . Acute on chronic renal failure (HCC) 10/13/2015  . GOUT 10/02/2008  . BACK PAIN 06/25/2007  . ERECTILE DYSFUNCTION 12/08/2006  . OSTEOARTHRITIS 12/08/2006  . TRANSIENT ISCHEMIC ATTACKS, HX OF 12/08/2006  . Essential hypertension 06/09/2006   Past Medical History:  Past Medical History:  Diagnosis Date  . Hypertension   . TIA (transient ischemic attack)    Past Surgical History: History reviewed. No pertinent surgical history. HPI:  Vincent Hogan is a 50 y.o. male with medical history significant of TIAs in the past on poorly controlled hypertension. He presented with reports of varying left sided weakness for 3 days.  MRI revealed scattered small acute infarcts in the left more than right cerebral white matter and cortex. Small acute infarcts in the left pons. Orders received for cognitive-linguistic evaluation.     Assessment / Plan / Recommendation Clinical Impression  Patient demonstrates moderate cognitive-linguistic impairments and demonstrates skills consistent with a score of 14 out of 22 on the [facility] Cognitive Assessment with a score of 18 or greater being considered to be within normal limits.  Expressive language appears to be greatest area of impairment and also impacts storage and retrieval of information.  Patient also demonstrates poor awareness of deficits, which impacts the patient's overall safety with complex problem solving tasks.  Patient would benefit from acute skilled SLP intervention in order to maximize his functional independence prior to discharge.  Anticipate patient  will require 24 hour supervision at home and follow up SLP services if expressive communication deficits continue.      SLP Assessment  Patient needs continued Speech Lanaguage Pathology Services    Follow Up Recommendations  Inpatient Rehab    Frequency and Duration min 2x/week  1 week      SLP Evaluation Prior Functioning  Cognitive/Linguistic Baseline: Within functional limits  Lives With: Other (Comment) (brother ) Education: college degree Vocation: Full time employment   Cognition  Overall Cognitive Status: Impaired/Different from baseline Arousal/Alertness: Awake/alert Orientation Level: Oriented to person;Oriented to place;Oriented to situation;Other (comment) (intermittently oriented to date during session ) Attention: Sustained Sustained Attention: Appears intact Memory: Impaired Memory Impairment: Storage deficit;Retrieval deficit;Decreased recall of new information Awareness: Impaired Awareness Impairment: Intellectual impairment Problem Solving: Impaired Problem Solving Impairment: Functional complex Executive Function: Sequencing;Self Monitoring;Self Correcting Sequencing: Impaired Sequencing Impairment: Functional complex Self Monitoring: Impaired Self Monitoring Impairment: Functional complex Self Correcting: Impaired Self Correcting Impairment: Functional complex Safety/Judgment: Impaired (due to poor awareness of deficits; however, OT and PT evals pending )    Comprehension  Auditory Comprehension Overall Auditory Comprehension: Impaired Commands: Impaired Multistep Basic Commands: 75-100% accurate Complex Commands: 75-100% accurate Conversation: Simple Interfering Components: Processing speed;Working Radio broadcast assistantmemory EffectiveTechniques: Wellsite geologistxtra processing time;Repetition;Visual/Gestural cues Visual Recognition/Discrimination Discrimination: Within Owens-IllinoisFunction Limits Reading Comprehension Reading Status: Within funtional limits    Expression  Expression Primary Mode of Expression: Verbal Verbal Expression Overall Verbal Expression: Impaired Initiation: No impairment Automatic Speech:  (WFL) Level of Generative/Spontaneous Verbalization: Sentence Repetition: Impaired Level of Impairment: Sentence level Naming: Impairment Responsive: 76-100% accurate Confrontation: Impaired Convergent: 75-100% accurate Divergent: 50-74% accurate Other Naming Comments: anomia in conversation  Verbal Errors: Not aware of errors;Other (comment) (long  pauses while word finding ) Pragmatics: No impairment Effective Techniques: Open ended questions Non-Verbal Means of Communication: Not applicable Written Expression Dominant Hand: Right Written Expression: Within Functional Limits   Oral / Motor  Oral Motor/Sensory Function Overall Oral Motor/Sensory Function: Mild impairment Facial ROM: Reduced left (trace labial ) Facial Symmetry: Abnormal symmetry left (trace labial ) Facial Strength: Within Functional Limits Facial Sensation: Within Functional Limits Lingual ROM: Within Functional Limits Lingual Symmetry: Within Functional Limits Lingual Strength: Within Functional Limits Velum: Within Functional Limits Mandible: Within Functional Limits Motor Speech Overall Motor Speech: Appears within functional limits for tasks assessed   GO          Functional Assessment Tool Used: skilled clinical judgement  Functional Limitations: Spoken language expressive Spoken Language Expression Current Status [phone]): At least 20 percent but less than 40 percent impaired, limited or restricted Spoken Language Expression Goal Status [phone](G9163): At least 1 percent but less than 20 percent impaired, limited or restricted         Charlane FerrettiMelissa Braylin Xu, M.A., CCC-SLP [phone]  Atari Novick 10/13/2015, 10:50 AM

## 2015-10-13 NOTE — Progress Notes (Signed)
Rehab Admissions Coordinator Note:  Patient was screened by Clois DupesBoyette, Glenice Ciccone Godwin for appropriateness for an Inpatient Acute Rehab Consult per OT recommendation. PT recommends OP. Pt noted observation status with [employer].  At this time, we are recommending Inpatient Rehab consult if pt qualifies for inpt status and MD wishes for us to pursue admission.  Clois DupesBoyette, Miner Koral Godwin 10/13/2015, 4:44 PM  I can be reached at [phone].

## 2015-10-14 ENCOUNTER — Inpatient Hospital Stay (HOSPITAL_COMMUNITY): Payer: BC Managed Care – PPO

## 2015-10-14 DIAGNOSIS — I639 Cerebral infarction, unspecified: Secondary | ICD-10-CM

## 2015-10-14 DIAGNOSIS — Z8673 Personal history of transient ischemic attack (TIA), and cerebral infarction without residual deficits: Secondary | ICD-10-CM

## 2015-10-14 DIAGNOSIS — S8992XA Unspecified injury of left lower leg, initial encounter: Secondary | ICD-10-CM

## 2015-10-14 DIAGNOSIS — I6340 Cerebral infarction due to embolism of unspecified cerebral artery: Secondary | ICD-10-CM

## 2015-10-14 DIAGNOSIS — S8992XS Unspecified injury of left lower leg, sequela: Secondary | ICD-10-CM

## 2015-10-14 DIAGNOSIS — N179 Acute kidney failure, unspecified: Secondary | ICD-10-CM

## 2015-10-14 DIAGNOSIS — D62 Acute posthemorrhagic anemia: Secondary | ICD-10-CM

## 2015-10-14 DIAGNOSIS — E785 Hyperlipidemia, unspecified: Secondary | ICD-10-CM

## 2015-10-14 DIAGNOSIS — N189 Chronic kidney disease, unspecified: Secondary | ICD-10-CM

## 2015-10-14 DIAGNOSIS — I10 Essential (primary) hypertension: Secondary | ICD-10-CM

## 2015-10-14 LAB — BASIC METABOLIC PANEL
Anion gap: 8 (ref 5–15)
BUN: 27 mg/dL — AB (ref 6–20)
CALCIUM: 9.4 mg/dL (ref 8.9–10.3)
CHLORIDE: 107 mmol/L (ref 101–111)
CO2: 21 mmol/L — ABNORMAL LOW (ref 22–32)
CREATININE: 1.65 mg/dL — AB (ref 0.61–1.24)
GFR calc Af Amer: 54 mL/min — ABNORMAL LOW (ref >60)
GFR, EST NON AFRICAN AMERICAN: 47 mL/min — AB (ref >60)
GLUCOSE: 109 mg/dL — AB (ref 65–99)
Potassium: 4.8 mmol/L (ref 3.5–5.1)
SODIUM: 136 mmol/L (ref 135–145)

## 2015-10-14 LAB — CBC
HCT: 34.5 % — ABNORMAL LOW (ref 39.0–52.0)
Hemoglobin: 11.5 g/dL — ABNORMAL LOW (ref 13.0–17.0)
MCH: 26.1 pg (ref 26.0–34.0)
MCHC: 33.3 g/dL (ref 30.0–36.0)
MCV: 78.2 fL (ref 78.0–100.0)
PLATELETS: 213 10*3/uL (ref 150–400)
RBC: 4.41 MIL/uL (ref 4.22–5.81)
RDW: 12.9 % (ref 11.5–15.5)
WBC: 4.1 10*3/uL (ref 4.0–10.5)

## 2015-10-14 LAB — HEMOGLOBIN A1C
Hgb A1c MFr Bld: 5.8 % — ABNORMAL HIGH (ref 4.8–5.6)
MEAN PLASMA GLUCOSE: 120 mg/dL

## 2015-10-14 MED ORDER — HEPARIN SODIUM (PORCINE) 5000 UNIT/ML IJ SOLN
5000.0000 [IU] | Freq: Three times a day (TID) | INTRAMUSCULAR | Status: DC
  Administered 2015-10-15 – 2015-10-16 (×6): 5000 [IU] via SUBCUTANEOUS
  Filled 2015-10-14 (×6): qty 1

## 2015-10-14 NOTE — Progress Notes (Signed)
Please refer to Dr. Eliane DecreePatel's consult. Pt felt progressing well to be able to go to outpt therapy once medical workup is complete. We will follow to see how he progresses until medical workup is complete. Roderic PalauGenie Logue can be reached at [phone] in my absence Thursday and Friday. [phone]

## 2015-10-14 NOTE — Progress Notes (Signed)
Speech Language Pathology Treatment: Cognitive-Linquistic  Patient Details Name: Vincent Hogan MRN: [id number] DOB: Feb 07, 1966 Today's Date: 10/14/2015 Time: [id number] SLP Time Calculation (min) (ACUTE ONLY): 14 min  Assessment / Plan / Recommendation Clinical Impression  Pt seen for speech therapy followup for aphasia. Pt was able to answer complex y/n questions and follow 3-step and complex directives with 100% acc, but was unable communicate details his conversation with the PA from CIR. Intermittent semantic paraphasias noted in conversational speech, however pt was able to self-correct at mod I level secondary to increased time. Aphasia improving, however pt would benefit from further therapy as he currently requires 24/7 supervision and is not independent with complex cognitive-linguistic tasks.    HPI HPI: Vincent Hogan is a 50 y.o. male with medical history significant of TIAs in the past on poorly controlled hypertension. He presented with reports of varying left sided weakness for 3 days.  MRI revealed scattered small acute infarcts in the left more than right cerebral white matter and cortex. Small acute infarcts in the left pons. Orders received for cognitive-linguistic evaluation.        SLP Plan  Continue with current plan of care     Recommendations                Follow up Recommendations: Inpatient Rehab (vs outpatient) Plan: Continue with current plan of care     GO                Vincent CraftsKara E Vincent Burdo MA, CCC-SLP 10/14/2015, 11:51 AM

## 2015-10-14 NOTE — Progress Notes (Signed)
Pt back from vascular.

## 2015-10-14 NOTE — Progress Notes (Addendum)
Transcranial Doppler with monitoring has been completed.   Bilateral middle cerebral arteries were insonated.  No HITS identified during 30 minutes of monitoring.   Farrel DemarkJill Eunice, RDMS, RVT 10/14/2015

## 2015-10-14 NOTE — Progress Notes (Signed)
Pt to vascular.

## 2015-10-14 NOTE — Progress Notes (Signed)
STROKE TEAM PROGRESS NOTE   SUBJECTIVE (INTERVAL HISTORY) No acute issue overnight. No complains. Patient would really like to go home and return for TEE Friday - understands his workup is not completed and has additional studies scheduled for today.   OBJECTIVE Temp:  [98 F (36.7 C)-98.8 F (37.1 C)] 98 F (36.7 C) (08/16 0807) Pulse Rate:  [69-92] 76 (08/16 0807) Cardiac Rhythm: Heart block (08/15 1900) Resp:  [15-20] 16 (08/16 0807) BP: (141-156)/(88-104) 145/92 (08/16 0807) SpO2:  [98 %-100 %] 100 % (08/16 0807)  CBC:   Recent Labs Lab 10/13/15 1049 10/14/15 0606  WBC 3.9* 4.1  HGB 12.0* 11.5*  HCT 36.5* 34.5*  MCV 79.2 78.2  PLT 228 213    Basic Metabolic Panel:   Recent Labs Lab 10/13/15 1049 10/14/15 0606  NA 137 136  K 4.6 4.8  CL 104 107  CO2 24 21*  GLUCOSE 137* 109*  BUN 30* 27*  CREATININE 1.77* 1.65*  CALCIUM 9.6 9.4    Lipid Panel:     Component Value Date/Time   CHOL 157 10/13/2015 0516   TRIG 82 10/13/2015 0516   HDL 31 (L) 10/13/2015 0516   CHOLHDL 5.1 10/13/2015 0516   VLDL 16 10/13/2015 0516   LDLCALC 110 (H) 10/13/2015 0516   HgbA1c:  Lab Results  Component Value Date   HGBA1C 5.8 (H) 10/13/2015   Urine Drug Screen:     Component Value Date/Time   LABOPIA NONE DETECTED 10/13/2015 0030   COCAINSCRNUR NONE DETECTED 10/13/2015 0030   LABBENZ NONE DETECTED 10/13/2015 0030   AMPHETMU NONE DETECTED 10/13/2015 0030   THCU NONE DETECTED 10/13/2015 0030   LABBARB NONE DETECTED 10/13/2015 0030      IMAGING I have personally reviewed the radiological images below and agree with the radiology interpretations.  Ct Head Wo Contrast 10/12/2015 1. Moderate diffuse age advanced white matter hypoattenuation with extension into the brainstem. The patient had significant white matter changes 13 years ago. This represents a significant progression, likely related to chronic microvascular ischemia. MRI without contrast could be used for  further characterization. 2. Multiple lacunar infarcts within the basal ganglia bilaterally are age indeterminate. 3. No definite acute intracranial abnormality.  [name] Mr Maxine GlennMra Head/brain Wo Cm 10/13/2015 1. Scattered small acute infarcts in the left more than right cerebral white matter and cortex. Small acute infarcts in the left pons. Pattern suggests central embolic disease. 2. Severe chronic microvascular disease. 3. Negative intracranial MRA.   Dg Chest 2 View 10/13/2015 No acute cardiopulmonary process seen.   [place] Renal 10/13/2015 No evidence of obstruction, cortical atrophy, or increased cortical echotexture. Simple appearing cysts bilaterally.  Carotid Doppler There is 1-39% bilateral ICA stenosis. Vertebral artery flow is antegrade.    2-D echocardiogram - pending   TCD bubble study - negative for PFO  TCD MES - negative for emboli   TEE pending    PHYSICAL EXAM General - Well nourished, well developed, in no apparent distress.  Ophthalmologic - Sharp disc margins OU.   Cardiovascular - Regular rate and rhythm.  Mental Status -  Level of arousal and orientation to time, place, and person were intact. Language including expression, naming, repetition, comprehension was assessed and found intact. Mildly slow mental processing.  Cranial Nerves II - XII - II - Visual field intact OU. III, IV, VI - Extraocular movements intact. V - Facial sensation intact bilaterally. VII - Facial movement intact bilaterally. VIII - Hearing & vestibular intact bilaterally. X -  Palate elevates symmetrically. XI - Chin turning & shoulder shrug intact bilaterally. XII - Tongue protrusion intact.  Motor Strength - The patient's strength was normal in all extremities and pronator drift was absent.  Bulk was normal and fasciculations were absent.   Motor Tone - Muscle tone was assessed at the neck and appendages and was normal.  Reflexes - The patient's reflexes were 1+ in  all extremities and he had no pathological reflexes.  Sensory - Light touch, temperature/pinprick were assessed and were symmetrical.    Coordination - The patient had normal movements in the hands with no ataxia or dysmetria.  Tremor was absent.  Gait and Station - deferred.   ASSESSMENT/PLAN [name] is a 50 y.o. male with history of TIAs and poorly controlled blood pressure presenting with L sided weakness. He did not receive IV t-PA due to delay in arrival.   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 Arranged with [facility] for Friday (this is first opening available). Ok to eat today.   If TEE negative, [facility] will consult and consider placement of an implantable loop recorder to evaluate for atrial fibrillation as etiology of stroke. This has been explained to patient/family by Dr. Roda Hogan and they are agreeable.   LDL 110  HgbA1c 5.8  SCDs for VTE prophylaxis Diet Heart Room service appropriate? Yes; Fluid consistency: Thin  aspirin 81 mg daily prior to admission, now on aspirin 325 mg daily. Continue ASA on discharge.  Patient counseled to be compliant with his antithrombotic medications  Ongoing aggressive stroke risk factor management  Therapy recommendations:  OT & SLP rec CIR, PT OP -> ok for CIR if admitted and MD wishes to pursue   Disposition:  pending  Hypertension  Stable Permissive hypertension (OK if < 220/120) but gradually normalize in 5-7 days Long-term BP goal  normotensive  Hyperlipidemia  Home meds:  No statin  LDL 110, goal < 70  Add lipitor 80mg   Continue statin at discharge  Other Stroke Risk Factors  Overweight, Body mass index is 28.04 kg/m., recommend weight loss, diet and exercise as appropriate   Hx stroke vs TIA - patient cannot provide any specific details about when this occurred and where his symptoms were except for a possible left facial droop  Family hx stroke (father, other)  Other Active Problems  Acute on Chronic Kidney disease -  Cre 1.94  Hospital day # 1  Marvel PlanJindong Raelin Pixler, MD PhD Stroke Neurology 10/14/2015 9:35 PM    To contact Stroke Continuity provider, please refer to [email or web address]. After hours, contact General Neurology

## 2015-10-14 NOTE — Progress Notes (Signed)
Patient Demographics:    Vincent Hogan, is a 50 y.o. male, DOB - 09/18/1965, ZOX:[id number]  Admit date - 10/12/2015   Admitting Physician Therisa Doyne, MD  Outpatient Primary MD for the patient is Vincent Ora, MD  LOS - 1   Chief Complaint  Patient presents with  . Hypertension        Subjective:    [name] today has no fevers, no emesis,  No chest pain,  Without new complaints, no fevers, no chills, speech is better   Assessment  & Plan :    Active Problems:   Essential hypertension   TIA (transient ischemic attack)   CVA (cerebral infarction)   Chronic kidney disease   Acute on chronic renal failure (HCC)   HLD (hyperlipidemia)   Cerebral infarction due to unspecified mechanism   1)Acute CVA-multiple infarcts, discussed with neurologist, continue aspirin and Lipitor, TEE on Friday, 10/16/2015  2)HTN- Allow some permissive Hypertension due to acute stroke, avoid very aggressive, rapid BP control at this time.     Code Status : full   Disposition Plan  : Rehabilitation versus home with home health  Consults  :  Neurology   DVT Prophylaxis  :  Heparin   Lab Results  Component Value Date   PLT 213 10/14/2015    Inpatient Medications  Scheduled Meds: . aspirin  300 mg Rectal Daily   Or  . aspirin  325 mg Oral Daily  . atorvastatin  80 mg Oral q1800  . verapamil  40 mg Oral Daily   Continuous Infusions: . sodium chloride 75 mL/hr at 10/14/15 0238   PRN Meds:.acetaminophen **OR** acetaminophen    Anti-infectives    None        Objective:   Vitals:   10/14/15 0159 10/14/15 0551 10/14/15 0807 10/14/15 1501  BP: (!) 141/90 (!) 141/104 (!) 145/92 (!) 152/87  Pulse: 71 92 76 65  Resp: 18 18 16 15   Temp: 98.8 F (37.1 C) 98.7 F (37.1 C) 98 F (36.7 C) 98.1 F (36.7 C)  TempSrc: Oral Oral Oral Oral  SpO2: 98% 100% 100% 99%  Weight:      Height:         Wt Readings from Last 3 Encounters:  10/13/15 107.3 kg (236 lb 8 oz)  11/04/08 (!) 115.7 kg (255 lb)  10/02/08 (!) 117.9 kg (260 lb)     Intake/Output Summary (Last 24 hours) at 10/14/15 1558 Last data filed at 10/14/15 0806  Gross per 24 hour  Intake                0 ml  Output             1400 ml  Net            -1400 ml     Physical Exam  Gen:- Awake Alert,  In no apparent distress  HEENT:- [place].AT, No sclera icterus Neck-Supple Neck,No JVD,.  Lungs-  CTAB  CV- S1, S2 normal Abd-  +ve B.Sounds, Abd Soft, No tenderness,    Extremity/Skin:- No  edema,    Neuro-no significant new focal deficits, speech is improved    Data Review:   Micro Results No results found for this or any previous visit (  from the past 240 hour(s)).  Radiology Reports Dg Chest 2 View  Result Date: 10/13/2015 CLINICAL DATA:  Admission chest radiograph. Transient ischemic attack. Initial encounter. EXAM: CHEST  2 VIEW COMPARISON:  Chest radiograph performed 07/13/2005 FINDINGS: The lungs are well-aerated and clear. There is no evidence of focal opacification, pleural effusion or pneumothorax. The heart is borderline normal in size. No acute osseous abnormalities are seen. IMPRESSION: No acute cardiopulmonary process seen. Electronically Signed   By: Roanna RaiderJeffery  Chang M.D.   On: 10/13/2015 01:22   Ct Head Wo Contrast  Result Date: 10/12/2015 CLINICAL DATA:  Weakness over the weekend which has since resolved. Personal history of TIAs and hypertension. EXAM: CT HEAD WITHOUT CONTRAST TECHNIQUE: Contiguous axial images were obtained from the base of the skull through the vertex without intravenous contrast. COMPARISON:  MRI of the brain 02/06/2003 FINDINGS: Moderate diffuse age advanced white matter hypoattenuation is evident bilaterally. There is scattered hypoattenuation throughout the basal ganglia and thalami bilaterally. T2 changes extend into the corona radiata bilaterally. The insular ribbon is intact.  No focal cortical defects are evident. The ventricles are proportionate to the degree of atrophy. No significant extra-axial fluid collection is present. Midline sagittal structures are within normal limits. White matter changes extend into the brainstem. The paranasal sinuses and mastoid air cells are clear. IMPRESSION: 1. Moderate diffuse age advanced white matter hypoattenuation with extension into the brainstem. The patient had significant white matter changes 13 years ago. This represents a significant progression, likely related to chronic microvascular ischemia. MRI without contrast could be used for further characterization. 2. Multiple lacunar infarcts within the basal ganglia bilaterally are age indeterminate. 3. No definite acute intracranial abnormality. Electronically Signed   By: Marin Robertshristopher  Mattern M.D.   On: 10/12/2015 23:03   [name]  Result Date: 10/13/2015 CLINICAL DATA:  TIA weakness and difficulty walking. EXAM: MRI HEAD WITHOUT CONTRAST MRA HEAD WITHOUT CONTRAST TECHNIQUE: Multiplanar, multiecho pulse sequences of the brain and surrounding structures were obtained without intravenous contrast. Angiographic images of the head were obtained using MRA technique without contrast. COMPARISON:  02/06/2003 FINDINGS: MRI HEAD FINDINGS Calvarium and upper cervical spine: Hypo intense appearance of cervical marrow is stable from 2004. Mild heterogeneity in the clivus is also stable. Question if this is related to patient's reported chronic renal failure. Orbits: Negative. Sinuses and Mastoids: Clear. Brain: Scattered subcentimeter acute white matter infarcts along the left lateral ventricle, from the frontal to occipital horns. Small acute infarct in the periatrial white matter on the right. Small left pontine acute infarct. Small posterior left frontal and bilateral lateral temporal cortex infarcts. No superimposed acute hemorrhage. No evidence of major vessel occlusion. Small-vessel  ischemic changes have developed diffusely since 2004, with numerous lacunes in the brainstem, cerebral white matter and deep grey nuclei. Progressive dilated perivascular spaces as seen with chronic hypertension. Confluent gliosis in the cerebral white matter. Brain volume is also decreased with corpus callosum thinning. There are numerous chronic hemorrhagic foci, both lobar and deep. Overall pattern suggests hypertensive hemorrhages. No hydrocephalus or mass lesion. MRA HEAD FINDINGS Symmetric carotid arteries. Left vertebral artery dominance with right vertebral artery ending in the PICA. Intact circle-of-Willis with sizable posterior communicating arteries. No major branch occlusion or proximal/treatable stenosis. No vessel beading. IMPRESSION: 1. Scattered small acute infarcts in the left more than right cerebral white matter and cortex. Small acute infarcts in the left pons. Pattern suggests central embolic disease. 2. Severe chronic microvascular disease. 3. Negative intracranial MRA. Electronically  Signed   By: Marnee Spring M.D.   On: 10/13/2015 08:52   US Renal  Result Date: 10/13/2015 CLINICAL DATA:  Chronic renal insufficiency with worsening of function over baseline; history of hypertension, previous T IA. EXAM: RENAL / URINARY TRACT ULTRASOUND COMPLETE COMPARISON:  None in PACs FINDINGS: Right Kidney: Length: 11.9 cm. The renal cortical echotexture remains lower than that of the adjacent liver. There is an exophytic midpole cyst laterally measuring 1.2 cm in greatest dimension. There is a medial mid upper pole parapelvic cyst measuring 1.3 cm in greatest dimension. There is no hydronephrosis. No stones are evident. Left Kidney: Length: 10.3 cm. The renal cortical echotexture is similar to that on the right. There is a mid to lower pole cortical cyst measuring 1.5 cm in diameter. Bladder: The partially distended urinary bladder is unremarkable. IMPRESSION: No evidence of obstruction, cortical  atrophy, or increased cortical echotexture. Simple appearing cysts bilaterally. Electronically Signed   By: David  [place] M.D.   On: 10/13/2015 09:01   Mr Maxine Glenn Head/brain JX Cm  Result Date: 10/13/2015 CLINICAL DATA:  TIA weakness and difficulty walking. EXAM: MRI HEAD WITHOUT CONTRAST MRA HEAD WITHOUT CONTRAST TECHNIQUE: Multiplanar, multiecho pulse sequences of the brain and surrounding structures were obtained without intravenous contrast. Angiographic images of the head were obtained using MRA technique without contrast. COMPARISON:  02/06/2003 FINDINGS: MRI HEAD FINDINGS Calvarium and upper cervical spine: Hypo intense appearance of cervical marrow is stable from 2004. Mild heterogeneity in the clivus is also stable. Question if this is related to patient's reported chronic renal failure. Orbits: Negative. Sinuses and Mastoids: Clear. Brain: Scattered subcentimeter acute white matter infarcts along the left lateral ventricle, from the frontal to occipital horns. Small acute infarct in the periatrial white matter on the right. Small left pontine acute infarct. Small posterior left frontal and bilateral lateral temporal cortex infarcts. No superimposed acute hemorrhage. No evidence of major vessel occlusion. Small-vessel ischemic changes have developed diffusely since 2004, with numerous lacunes in the brainstem, cerebral white matter and deep grey nuclei. Progressive dilated perivascular spaces as seen with chronic hypertension. Confluent gliosis in the cerebral white matter. Brain volume is also decreased with corpus callosum thinning. There are numerous chronic hemorrhagic foci, both lobar and deep. Overall pattern suggests hypertensive hemorrhages. No hydrocephalus or mass lesion. MRA HEAD FINDINGS Symmetric carotid arteries. Left vertebral artery dominance with right vertebral artery ending in the PICA. Intact circle-of-Willis with sizable posterior communicating arteries. No major branch occlusion or  proximal/treatable stenosis. No vessel beading. IMPRESSION: 1. Scattered small acute infarcts in the left more than right cerebral white matter and cortex. Small acute infarcts in the left pons. Pattern suggests central embolic disease. 2. Severe chronic microvascular disease. 3. Negative intracranial MRA. Electronically Signed   By: Marnee Spring M.D.   On: 10/13/2015 08:52     CBC  Recent Labs Lab 10/12/15 1748 10/13/15 1049 10/14/15 0606  WBC 4.1 3.9* 4.1  HGB 13.0 12.0* 11.5*  HCT 37.6* 36.5* 34.5*  PLT 242 228 213  MCV 76.6* 79.2 78.2  MCH 26.5 26.0 26.1  MCHC 34.6 32.9 33.3  RDW 13.1 13.1 12.9    Chemistries   Recent Labs Lab 10/12/15 1748 10/13/15 1049 10/14/15 0606  NA 137 137 136  K 4.2 4.6 4.8  CL 104 104 107  CO2 24 24 21*  GLUCOSE 97 137* 109*  BUN 38* 30* 27*  CREATININE 1.94* 1.77* 1.65*  CALCIUM 9.5 9.6 9.4   ------------------------------------------------------------------------------------------------------------------  Recent Labs  10/13/15 0516  CHOL 157  HDL 31*  LDLCALC 110*  TRIG 82  CHOLHDL 5.1    Lab Results  Component Value Date   HGBA1C 5.8 (H) 10/13/2015   ------------------------------------------------------------------------------------------------------------------ No results for input(s): TSH, T4TOTAL, T3FREE, THYROIDAB in the last 72 hours.  Invalid input(s): FREET3 ------------------------------------------------------------------------------------------------------------------ No results for input(s): VITAMINB12, FOLATE, FERRITIN, TIBC, IRON, RETICCTPCT in the last 72 hours.  Coagulation profile No results for input(s): INR, PROTIME in the last 168 hours.  No results for input(s): DDIMER in the last 72 hours.  Cardiac Enzymes  Recent Labs Lab 10/13/15 0516  TROPONINI 0.05*   ------------------------------------------------------------------------------------------------------------------ No results found  for: BNP   Musette Kisamore M.D on 10/14/2015 at 3:58 PM  Between 7am to 7pm - Pager - [phone]  After 7pm go to [email or web address] - password [email or web address]  [facility] -  Office  [phone]  Dragon dictation system was used to create this note, attempts have been made to correct errors, however presence of uncorrected errors is not a reflection quality of care provided

## 2015-10-14 NOTE — Procedures (Signed)
[facility]  [address]  [place]. [place] [id number].  [phone]   TRANSCRANIAL DOPPLER BUBBLE STUDY  Vincent Hogan  Date of Birth: March 17, 1965 Medical Record Number: [id number] Indications: embolic stroke in young Date of Procedure: 10/14/15 Clinical History: stroke Technical Description: Transcranial Doppler Bubble Study was performed at the bedside after taking written informed consent from the patient and explaining risk/benefits. The both middle cerebral artery was insonated using a hand held probe. And IV line had been previously inserted in the left forearm by the RN using aseptic precautions. Agitated saline injection at rest and after valsalva maneuver did not result in high intensity transient signals (HITS).  Impression: negative Transcranial Doppler Bubble Study indicative of no right to left shunt   Results were explained to the patient. Questions were answered.  Marvel PlanJindong Dasha Kawabata, MD PhD Stroke Neurology 10/14/2015 9:37 PM

## 2015-10-14 NOTE — Progress Notes (Signed)
Physical Therapy Treatment Patient Details Name: Vincent RavelingWesley D Millis MRN: [id number] DOB: August 24, 1965 Today's Date: 10/14/2015    History of Present Illness Vincent PattyWesley D Spenceris a 50 y.o.malewith medical history significant ofTIAs in the past on poorly controlled hypertension. Pt admitted with L sided weakness and fatigue. 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    PT Comments    Pt is progressing toward goals, however continues to demonstrate significantly decreased awareness of balance deficits, safety, and impaired problem solving . Requires min guard for functional mobility and verbal cues for safety in changing environment, but continues to deny the need for assistance. Pt is at an increased fall risk as demonstrated by a DGI score of 18.  Pt wishes to return to work as soon as possible, but is not safe to do so with current deficits. PT recommends CIR to address high level balance deficits, safe stair training, and awareness to achieve mod I for safe return to home and work. Continue acute follow.    Follow Up Recommendations  CIR;Supervision for mobility/OOB     Equipment Recommendations  None recommended by PT    Recommendations for Other Services Rehab consult     Precautions / Restrictions Precautions Precautions: Fall Restrictions Weight Bearing Restrictions: No    Mobility  Bed Mobility Overal bed mobility: Needs Assistance Bed Mobility: Supine to Sit     Supine to sit: Supervision        Transfers Overall transfer level: Needs assistance Equipment used: None Transfers: Sit to/from Stand Sit to Stand: Min guard            Ambulation/Gait Ambulation/Gait assistance: Min guard Ambulation Distance (Feet): 200 Feet Assistive device: None Gait Pattern/deviations: Step-through pattern;Drifts right/left (Drifts to right)   Gait velocity interpretation: at or above normal speed for age/gender (Tendency to speed up due to LOB) General Gait Details:  Pt required mod verbal cues to step away from wall to avoid hitting R UE while ambulating. Decreased awareness of drift to R and decreasd ability to avoid smaller obstacles on R side.       Stairs Stairs: Yes Stairs assistance: Min guard Stair Management: One rail Right;Step to pattern;Alternating pattern Number of Stairs: 4 General stair comments: Pt required min guard for safety. Steady ascending stairs with alternating pattern. Unsteady while descending, with step to pattern. First foot touch down very heavy on stair.  Pt stated this is how he always does stairs and doesn't feel any different than normal.   Wheelchair Mobility    Modified Rankin (Stroke Patients Only) Modified Rankin (Stroke Patients Only) Pre-Morbid Rankin Score: No symptoms Modified Rankin: Moderately severe disability     Balance Overall balance assessment: Needs assistance Sitting-balance support: No upper extremity supported;Feet supported Sitting balance-Leahy Scale: Good     Standing balance support: No upper extremity supported;During functional activity Standing balance-Leahy Scale: Fair Standing balance comment: Pt able to bend over to pick up object from floor without LOB      Tandem Stance - Right Leg: 0 (Unable to achieve position and maintain without LOB) Tandem Stance - Left Leg: 0 (Unable to achieve position and maintain without LOB) Rhomberg - Eyes Opened: 30 (sec, no LOB) Rhomberg - Eyes Closed: 30 (sec, no LOB)        Cognition Arousal/Alertness: Awake/alert Behavior During Therapy: Impulsive Overall Cognitive Status: Impaired/Different from baseline Area of Impairment: Safety/judgement;Problem solving;Following commands       Following Commands: Follows multi-step commands inconsistently Safety/Judgement: Decreased awareness of safety;Decreased  awareness of deficits   Problem Solving: Slow processing;Requires verbal cues General Comments: Pt requiring repeated instructions to  perform tasks during DGI.  Increased time to problem solve noted while attempting to open toothbrush package.      Exercises      General Comments General comments (skin integrity, edema, etc.): Pt stood at sink to brush teeth. Able to apply toothpaste and brush, while occassionally leaning hips into edge of sink. 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         Pertinent Vitals/Pain Pain Assessment: No/denies pain    Home Living                      Prior Function            PT Goals (current goals can now be found in the care plan section) Acute Rehab PT Goals Patient Stated Goal: to go home  PT Goal Formulation: With patient Time For Goal Achievement: 10/20/15 Potential to Achieve Goals: Good Progress towards PT goals: Progressing toward goals    Frequency  Min 4X/week    PT Plan Discharge plan needs to be updated    Co-evaluation             End of Session Equipment Utilized During Treatment: Gait belt Activity Tolerance: Patient tolerated treatment well Patient left: in chair;with chair alarm set;with call bell/phone within reach     Time: 0924-0952 PT Time Calculation (min) (ACUTE ONLY): 28 min  Charges:  $Gait Training: 8-22 mins $Therapeutic Activity: 8-22 mins                    G Codes:      Kitai Purdom 10/14/2015, 1:48 PM Park Literara A Adonijah Baena, SPT (student physical therapist) Acute Rehabilitation Services [phone]

## 2015-10-14 NOTE — Progress Notes (Signed)
Transcranial Doppler bubble study has been completed.  Dr. Roda ShuttersXu performed test. Bilateral middle cerebral arteries were insonated.  IV site: left AC  0 HITS heard at rest. 0 HITS heard during Valsalva.  No apparent PFO.    Farrel DemarkJill Eunice, RDMS, RVT 10/14/2015

## 2015-10-14 NOTE — Consult Note (Signed)
Physical Medicine and Rehabilitation Consult  Reason for Consult:  Left sided weakness Referring Physician: Dr. Janee Mornhompson   HPI: Vincent Hogan is a 50 y.o. ?left handed male with history of multiple TIAs, HTN, L-knee injury who developed difficulty walking for about a week progressing to LLE weakness requiring AD. He denied any deficits and required encouragement by family to seek medical attention and was evaluated in ED on 10/12/15 and found to have mild left facial droop (reported to be chronic?), word finding deficits as well as mild left sided weakness. MRI/MRA brain done revealing scattered small acute infarcts left more than right white matter, small acute infarcts left pons and severe chronic microvascular disease. Neurology consulted and recommended follow work up for embolic source. Carotid dopplers without ICA stenosis. 2D echo/TEE/LE dopplers/TCD pending. PT/OT evaluations done yesterday and CIR recommended due to poor safety with lack of awareness of deficits, staggering gait and multiple LOB.   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 Has been active and walks every day. Has felt weak for the past week and didn't take his medications "because it was downstairs".   Review of Systems  HENT: Negative for hearing loss.   Eyes: Negative for blurred vision and double vision.  Respiratory: Negative for cough, hemoptysis and sputum production.   Cardiovascular: Negative for chest pain and palpitations.  Gastrointestinal: Negative for constipation, heartburn and nausea.  Genitourinary: Negative for frequency and urgency.  Musculoskeletal: Negative for back pain, myalgias and neck pain.  Skin: Negative for itching and rash.  Neurological: Negative for sensory change, speech change, focal weakness, weakness and headaches.  Psychiatric/Behavioral: Negative for depression. The patient does not have insomnia.   All other systems reviewed and are negative.     Past  Medical History:  Diagnosis Date  . Hypertension   . TIA (transient ischemic attack)     History reviewed. No pertinent surgical history.    Family History  Problem Relation Age of Onset  . Dementia Mother   . Stroke Father   . Hypertension Father   . Hypertension Sister   . Hypertension Brother   . Stroke Other   . CAD Neg Hx   . Cancer Neg Hx     Social History:  Single lives with brother (who is retired). He  reports that he has never smoked. He has never used smokeless tobacco. He reports that he does not drink alcohol or use drugs.    Allergies  Allergen Reactions  . Lisinopril     REACTION: cough    Medications Prior to Admission  Medication Sig Dispense Refill  . aspirin EC 81 MG tablet Take 81 mg by mouth daily.    . hydrochlorothiazide (HYDRODIURIL) 25 MG tablet Take 25 mg by mouth daily.    Marland Kitchen. lisinopril (PRINIVIL,ZESTRIL) 20 MG tablet Take 20 mg by mouth daily.    . verapamil (CALAN) 40 MG tablet Take 40 mg by mouth daily.      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 Functional Status:  Mobility: Bed Mobility Overal bed mobility: Needs Assistance Bed Mobility: Supine to Sit Supine to sit: Supervision, HOB  elevated General bed mobility comments: in chair on arrival Transfers Overall transfer level: Needs assistance Equipment used: None Transfers: Sit to/from Stand Sit to Stand: Min guard General transfer comment: Pt with increased time sit>stand. Min guard for safety. Ambulation/Gait Ambulation/Gait assistance: Min guard Ambulation Distance (Feet): 150 Feet Assistive device: None Gait Pattern/deviations: Step-through pattern, Staggering right, Drifts right/left (drifts to R ) General Gait Details: Pt with decreased L stance time due to L knee (previous injury). Pt reports feeling steady on feet, however had LOB x 3 into R wall. Pt able to self correct with min guard and verbal cueing. when asked, pt reported "the wall jumped out in front of me". Gait velocity interpretation: at or above normal speed for age/gender (Tendency to speed up due to LOB.)    ADL: ADL Overall ADL's : Needs assistance/impaired Eating/Feeding Details (indicate cue type and reason): pt currently holding a tooth . Tooth placed in pink container in room. Brother asked to bring oral care glue that patient is requesting to help keep tooth in proper placement Grooming: Wash/dry hands, Set up Toilet Transfer: Min guard Tub/ Engineer, structural: International aid/development worker Details (indicate cue type and reason): pt needed incr time to complete sequence and unsteady with transfer. pt holding onto environmental supports General ADL Comments: pt asked to close eyes for 10 seconds with great sway and reaching for environmental support  Cognition: Cognition Overall Cognitive Status: Impaired/Different from baseline Arousal/Alertness: Awake/alert Orientation Level: Oriented X4 Attention: Sustained Sustained Attention: Appears intact Memory: Impaired Memory Impairment: Storage deficit, Retrieval deficit, Decreased recall of new information Awareness: Impaired Awareness Impairment: Intellectual impairment Problem  Solving: Impaired Problem Solving Impairment: Functional complex Executive Function: Sequencing, Self Monitoring, Self Correcting Sequencing: Impaired Sequencing Impairment: Functional complex Self Monitoring: Impaired Self Monitoring Impairment: Functional complex Self Correcting: Impaired Self Correcting Impairment: Functional complex Safety/Judgment: Impaired (due to poor awareness of deficits; however, OT and PT evals pending ) Cognition Arousal/Alertness: Awake/alert Behavior During Therapy: Impulsive Overall Cognitive Status: Impaired/Different from baseline Area of Impairment: Awareness, Safety/judgement Memory: Decreased short-term memory Safety/Judgement: Decreased awareness of safety, Decreased awareness of deficits Awareness: Anticipatory General Comments: pt reports baseline and repeating same story twice during session. pt needed redirection to task several times. Pt reports all changes were family observations but he is fine. pt with no recall of MD visiting or dx.    Blood pressure (!) 145/92, pulse 76, temperature 98 F (36.7 C), temperature source Oral, resp. rate 16, height 6\' 5"  (1.956 m), weight 107.3 kg (236 lb 8 oz), SpO2 100 %. Physical Exam  Vitals reviewed. Constitutional: He is oriented to person, place, and time. He appears well-developed and well-nourished.  HENT:  Head: Normocephalic and atraumatic.  Mouth/Throat: Oropharynx is clear and moist.  Eyes: Conjunctivae and EOM are normal. Pupils are equal, round, and reactive to light. Right eye exhibits no discharge. Left eye exhibits no discharge.  Neck: Normal range of motion. Neck supple.  Cardiovascular: Normal rate and regular rhythm.   Respiratory: Effort normal and breath sounds normal. No stridor. No respiratory distress. He has no wheezes.  GI: Soft. Bowel sounds are normal. He exhibits no distension. There is no tenderness.  Musculoskeletal: He exhibits no  edema or tenderness.  Neurological: He is  alert and oriented to person, place, and time. No cranial nerve deficit.  Speech clear.   Was able to follow one and two step commands without difficulty.  Motor: 5/5 throughout Sensation intact to light touch DTRs symmetric  Skin: Skin is warm and dry. No rash noted. No erythema.  Psychiatric: He has a normal mood and affect. His behavior is normal. Thought content normal.    Results for orders placed or performed during the hospital encounter of 10/12/15 (from the past 24 hour(s))  Basic metabolic panel     Status: Abnormal   Collection Time: 10/13/15 10:49 AM  Result Value Ref Range   Sodium 137 135 - 145 mmol/L   Potassium 4.6 3.5 - 5.1 mmol/L   Chloride 104 101 - 111 mmol/L   CO2 24 22 - 32 mmol/L   Glucose, Bld 137 (H) 65 - 99 mg/dL   BUN 30 (H) 6 - 20 mg/dL   Creatinine, Ser 6.96 (H) 0.61 - 1.24 mg/dL   Calcium 9.6 8.9 - 29.5 mg/dL   GFR calc non Af Amer 43 (L) >60 mL/min   GFR calc Af Amer 50 (L) >60 mL/min   Anion gap 9 5 - 15  CBC     Status: Abnormal   Collection Time: 10/13/15 10:49 AM  Result Value Ref Range   WBC 3.9 (L) 4.0 - 10.5 K/uL   RBC 4.61 4.22 - 5.81 MIL/uL   Hemoglobin 12.0 (L) 13.0 - 17.0 g/dL   HCT 28.4 (L) 13.2 - 44.0 %   MCV 79.2 78.0 - 100.0 fL   MCH 26.0 26.0 - 34.0 pg   MCHC 32.9 30.0 - 36.0 g/dL   RDW 10.2 72.5 - 36.6 %   Platelets 228 150 - 400 K/uL  CBC     Status: Abnormal   Collection Time: 10/14/15  6:06 AM  Result Value Ref Range   WBC 4.1 4.0 - 10.5 K/uL   RBC 4.41 4.22 - 5.81 MIL/uL   Hemoglobin 11.5 (L) 13.0 - 17.0 g/dL   HCT 44.0 (L) 34.7 - 42.5 %   MCV 78.2 78.0 - 100.0 fL   MCH 26.1 26.0 - 34.0 pg   MCHC 33.3 30.0 - 36.0 g/dL   RDW 95.6 38.7 - 56.4 %   Platelets 213 150 - 400 K/uL  Basic metabolic panel     Status: Abnormal   Collection Time: 10/14/15  6:06 AM  Result Value Ref Range   Sodium 136 135 - 145 mmol/L   Potassium 4.8 3.5 - 5.1 mmol/L   Chloride 107 101 - 111 mmol/L   CO2 21 (L) 22 - 32 mmol/L   Glucose,  Bld 109 (H) 65 - 99 mg/dL   BUN 27 (H) 6 - 20 mg/dL   Creatinine, Ser 3.32 (H) 0.61 - 1.24 mg/dL   Calcium 9.4 8.9 - 95.1 mg/dL   GFR calc non Af Amer 47 (L) >60 mL/min   GFR calc Af Amer 54 (L) >60 mL/min   Anion gap 8 5 - 15   Dg Chest 2 View  Result Date: 10/13/2015 CLINICAL DATA:  Admission chest radiograph. Transient ischemic attack. Initial encounter. EXAM: CHEST  2 VIEW COMPARISON:  Chest radiograph performed 07/13/2005 FINDINGS: The lungs are well-aerated and clear. There is no evidence of focal opacification, pleural effusion or pneumothorax. The heart is borderline normal in size. No acute osseous abnormalities are seen. IMPRESSION: No acute cardiopulmonary process seen. Electronically Signed  By: Roanna Raider M.D.   On: 10/13/2015 01:22   Ct Head Wo Contrast  Result Date: 10/12/2015 CLINICAL DATA:  Weakness over the weekend which has since resolved. Personal history of TIAs and hypertension. EXAM: CT HEAD WITHOUT CONTRAST TECHNIQUE: Contiguous axial images were obtained from the base of the skull through the vertex without intravenous contrast. COMPARISON:  MRI of the brain 02/06/2003 FINDINGS: Moderate diffuse age advanced white matter hypoattenuation is evident bilaterally. There is scattered hypoattenuation throughout the basal ganglia and thalami bilaterally. T2 changes extend into the corona radiata bilaterally. The insular ribbon is intact. No focal cortical defects are evident. The ventricles are proportionate to the degree of atrophy. No significant extra-axial fluid collection is present. Midline sagittal structures are within normal limits. White matter changes extend into the brainstem. The paranasal sinuses and mastoid air cells are clear. IMPRESSION: 1. Moderate diffuse age advanced white matter hypoattenuation with extension into the brainstem. The patient had significant white matter changes 13 years ago. This represents a significant progression, likely related to chronic  microvascular ischemia. MRI without contrast could be used for further characterization. 2. Multiple lacunar infarcts within the basal ganglia bilaterally are age indeterminate. 3. No definite acute intracranial abnormality. Electronically Signed   By: Marin Roberts M.D.   On: 10/12/2015 23:03   [name]  Result Date: 10/13/2015 CLINICAL DATA:  TIA weakness and difficulty walking. EXAM: MRI HEAD WITHOUT CONTRAST MRA HEAD WITHOUT CONTRAST TECHNIQUE: Multiplanar, multiecho pulse sequences of the brain and surrounding structures were obtained without intravenous contrast. Angiographic images of the head were obtained using MRA technique without contrast. COMPARISON:  02/06/2003 FINDINGS: MRI HEAD FINDINGS Calvarium and upper cervical spine: Hypo intense appearance of cervical marrow is stable from 2004. Mild heterogeneity in the clivus is also stable. Question if this is related to patient's reported chronic renal failure. Orbits: Negative. Sinuses and Mastoids: Clear. Brain: Scattered subcentimeter acute white matter infarcts along the left lateral ventricle, from the frontal to occipital horns. Small acute infarct in the periatrial white matter on the right. Small left pontine acute infarct. Small posterior left frontal and bilateral lateral temporal cortex infarcts. No superimposed acute hemorrhage. No evidence of major vessel occlusion. Small-vessel ischemic changes have developed diffusely since 2004, with numerous lacunes in the brainstem, cerebral white matter and deep grey nuclei. Progressive dilated perivascular spaces as seen with chronic hypertension. Confluent gliosis in the cerebral white matter. Brain volume is also decreased with corpus callosum thinning. There are numerous chronic hemorrhagic foci, both lobar and deep. Overall pattern suggests hypertensive hemorrhages. No hydrocephalus or mass lesion. MRA HEAD FINDINGS Symmetric carotid arteries. Left vertebral artery dominance  with right vertebral artery ending in the PICA. Intact circle-of-Willis with sizable posterior communicating arteries. No major branch occlusion or proximal/treatable stenosis. No vessel beading. IMPRESSION: 1. Scattered small acute infarcts in the left more than right cerebral white matter and cortex. Small acute infarcts in the left pons. Pattern suggests central embolic disease. 2. Severe chronic microvascular disease. 3. Negative intracranial MRA. Electronically Signed   By: Marnee Spring M.D.   On: 10/13/2015 08:52   US Renal  Result Date: 10/13/2015 CLINICAL DATA:  Chronic renal insufficiency with worsening of function over baseline; history of hypertension, previous T IA. EXAM: RENAL / URINARY TRACT ULTRASOUND COMPLETE COMPARISON:  None in PACs FINDINGS: Right Kidney: Length: 11.9 cm. The renal cortical echotexture remains lower than that of the adjacent liver. There is an exophytic midpole cyst laterally measuring 1.2 cm  in greatest dimension. There is a medial mid upper pole parapelvic cyst measuring 1.3 cm in greatest dimension. There is no hydronephrosis. No stones are evident. Left Kidney: Length: 10.3 cm. The renal cortical echotexture is similar to that on the right. There is a mid to lower pole cortical cyst measuring 1.5 cm in diameter. Bladder: The partially distended urinary bladder is unremarkable. IMPRESSION: No evidence of obstruction, cortical atrophy, or increased cortical echotexture. Simple appearing cysts bilaterally. Electronically Signed   By: David  [place] M.D.   On: 10/13/2015 09:01   Mr Maxine Glenn Head/brain GM Cm  Result Date: 10/13/2015 CLINICAL DATA:  TIA weakness and difficulty walking. EXAM: MRI HEAD WITHOUT CONTRAST MRA HEAD WITHOUT CONTRAST TECHNIQUE: Multiplanar, multiecho pulse sequences of the brain and surrounding structures were obtained without intravenous contrast. Angiographic images of the head were obtained using MRA technique without contrast. COMPARISON:   02/06/2003 FINDINGS: MRI HEAD FINDINGS Calvarium and upper cervical spine: Hypo intense appearance of cervical marrow is stable from 2004. Mild heterogeneity in the clivus is also stable. Question if this is related to patient's reported chronic renal failure. Orbits: Negative. Sinuses and Mastoids: Clear. Brain: Scattered subcentimeter acute white matter infarcts along the left lateral ventricle, from the frontal to occipital horns. Small acute infarct in the periatrial white matter on the right. Small left pontine acute infarct. Small posterior left frontal and bilateral lateral temporal cortex infarcts. No superimposed acute hemorrhage. No evidence of major vessel occlusion. Small-vessel ischemic changes have developed diffusely since 2004, with numerous lacunes in the brainstem, cerebral white matter and deep grey nuclei. Progressive dilated perivascular spaces as seen with chronic hypertension. Confluent gliosis in the cerebral white matter. Brain volume is also decreased with corpus callosum thinning. There are numerous chronic hemorrhagic foci, both lobar and deep. Overall pattern suggests hypertensive hemorrhages. No hydrocephalus or mass lesion. MRA HEAD FINDINGS Symmetric carotid arteries. Left vertebral artery dominance with right vertebral artery ending in the PICA. Intact circle-of-Willis with sizable posterior communicating arteries. No major branch occlusion or proximal/treatable stenosis. No vessel beading. IMPRESSION: 1. Scattered small acute infarcts in the left more than right cerebral white matter and cortex. Small acute infarcts in the left pons. Pattern suggests central embolic disease. 2. Severe chronic microvascular disease. 3. Negative intracranial MRA. Electronically Signed   By: Marnee Spring M.D.   On: 10/13/2015 08:52    Assessment/Plan: Diagnosis: Bilateral small acute infarcts Labs and images independently reviewed.  Records reviewed and summated above. Stroke: Continue  secondary stroke prophylaxis and Risk Factor Modification listed below:   Antiplatelet therapy:   Blood Pressure Management:  Continue current medication with prn's with permisive HTN per primary team Statin Agent:   Prediabetes management:    1. Does the need for close, 24 hr/day medical supervision in concert with the patient's rehab needs make it unreasonable for this patient to be served in a less intensive setting? No 2. Co-Morbidities requiring supervision/potential complications: TIAs (cont meds), HTN (monitor and provide prns in accordance with increased physical exertion and pain), L-knee injury, AKI on CKD (avoid nephrotoxic meds), ABLA (transfuse if necessary to ensure appropriate perfusion for increased activity tolerance) 3. Due to safety, disease management and patient education, does the patient require 24 hr/day rehab nursing? No 4. Does the patient require coordinated care of a physician, rehab nurse, PT (1-2 hrs/day, 5 days/week) and OT (1-2 hrs/day, 5 days/week) to address physical and functional deficits in the context of the above medical diagnosis(es)? No Addressing deficits in the following  areas: balance, endurance, locomotion, transferring, toileting and psychosocial support 5. Can the patient actively participate in an intensive therapy program of at least 3 hrs of therapy per day at least 5 days per week? Yes 6. The potential for patient to make measurable gains while on inpatient rehab is fair 7. Anticipated functional outcomes upon discharge from inpatient rehab are NA  with PT, n/a with OT, n/a with SLP. 8. Estimated rehab length of stay to reach the above functional goals is: NA 9. Does the patient have adequate social supports and living environment to accommodate these discharge functional goals? Yes 10. Anticipated D/C setting: Home 11. Anticipated post D/C treatments: Outpatient therapy and Home excercise program 12. Overall Rehab/Functional Prognosis:  good  RECOMMENDATIONS: This patient's condition is appropriate for continued rehabilitative care in the following setting: No physical therapy evaluation, however, pt is high functioning with minimal to no deficits from stroke.  Recommodend d/c home after completion of medical workup with outpatient therapies.  Would recommend pt follow up with PM&R as outpt.  Patient has agreed to participate in recommended program. Potentially Note that insurance prior authorization may be required for reimbursement for recommended care.  Comment: Rehab Admissions Coordinator to follow up.  Maryla MorrowAnkit Pepper Wyndham, MD 10/14/2015

## 2015-10-15 ENCOUNTER — Other Ambulatory Visit (HOSPITAL_COMMUNITY): Payer: BC Managed Care – PPO

## 2015-10-15 NOTE — Progress Notes (Signed)
Occupational Therapy Treatment Patient Details Name: Vincent Hogan MRN: [id number] DOB: 21-May-1965 Today's Date: 10/15/2015    History of present illness Vincent Hogan a 49 y.o.malewith medical history significant ofTIAs in the past on poorly controlled hypertension. Pt admitted with L sided weakness and fatigue. 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   OT comments  Pt is able to perform ADLs at min guard assist level.  He had impaired memory, decreased awareness of deficits, decreased safety awareness, perceptual deficits and appears to have visual scanning deficits. He will require initial 24 hour supervision at home - discussed with him and his brother (brother reports he will be able to provide assist).  Instructed him to supervise medication management, finances, cooking.  Also recommend no driving (veers to Rt when walking with no awareness), and no working until cleared by OP therapists and MD.   Follow Up Recommendations  Outpatient OT;Supervision/Assistance - 24 hour    Equipment Recommendations  None recommended by OT    Recommendations for Other Services      Precautions / Restrictions Precautions Precautions: Fall       Mobility Bed Mobility Overal bed mobility: Independent             General bed mobility comments: in chair on arrival  Transfers Overall transfer level: Needs assistance Equipment used: None Transfers: Sit to/from Stand;Stand Pivot Transfers Sit to Stand: Min guard Stand pivot transfers: Min guard       General transfer comment: LOB x 1 when standing after sitting back to don shoes due to L UE slipping off arm of chair, very dependent on UE support    Balance Overall balance assessment: Needs assistance Sitting-balance support: Feet supported Sitting balance-Leahy Scale: Good       Standing balance-Leahy Scale: Fair Standing balance comment: deferred balance work due to pain in L foot                    ADL Overall ADL's : Needs assistance/impaired                     Lower Body Dressing: Min guard;Sit to/from stand   Toilet Transfer: Min guard;Ambulation;Comfort height toilet;RW       Tub/ Shower Transfer: Min guard;Tub transfer;Ambulation;Rolling walker Tub/Shower Transfer Details (indicate cue type and reason): Instructed pt to use seat in shower initially to prevent fall  Functional mobility during ADLs: Min guard;Rolling walker        Vision                 Additional Comments: Pt able to read full page of information, but misreads words mid page - appears to have visual scanning deficits. Pt veers to Rt when ambualating with no awareness    Perception     Praxis      Cognition   Behavior During Therapy: Impulsive Overall Cognitive Status: Impaired/Different from baseline Area of Impairment: Attention;Memory;Safety/judgement;Awareness;Problem solving   Current Attention Level: Alternating Memory: Decreased short-term memory  Following Commands: Follows multi-step commands inconsistently Safety/Judgement: Decreased awareness of safety;Decreased awareness of deficits Awareness: Emergent Problem Solving: Slow processing;Difficulty sequencing;Requires tactile cues;Requires verbal cues General Comments: Pt denies that SLP discussed cognitive problems with him.  He acknowleges only that his speech is not normal, and Lt foot hurts as sequelae of CVA.  Pt reports he has read CVA booklet, but unable to give any detailed information of what he read.  Had him re-read info re: CVA  and s/s of CVA.  He was unable to recall what he had read after 1 minute delay.   Long discussion with pt and brother re: deficits, need for supervision with medication management, use of stove, financial management, no driving, no working until cleared by MD and OP therapies     Extremity/Trunk Assessment               Exercises     Shoulder Instructions       General  Comments      Pertinent Vitals/ Pain       Pain Assessment: Faces Pain Score: 6  Faces Pain Scale: Hurts little more Pain Location: Lt foot  Pain Descriptors / Indicators: Discomfort Pain Intervention(s): Monitored during session  Home Living                                          Prior Functioning/Environment              Frequency Min 2X/week     Progress Toward Goals  OT Goals(current goals can now be found in the care plan section)  Progress towards OT goals: Progressing toward goals  ADL Goals Pt Will Transfer to Toilet: with supervision;regular height toilet Pt Will Perform Tub/Shower Transfer: Shower transfer;with supervision;ambulating Additional ADL Goal #1: Pt will complete vision task with less than 2 errors Additional ADL Goal #2: Pt will complete mOCa with score >19   Plan Discharge plan needs to be updated    Co-evaluation                 End of Session Equipment Utilized During Treatment: Rolling walker   Activity Tolerance Patient tolerated treatment well   Patient Left in bed;with call bell/phone within reach;with bed alarm set;with family/visitor present   Nurse Communication Mobility status        Time: [id number] OT Time Calculation (min): 44 min  Charges: OT General Charges $OT Visit: 1 Procedure OT Treatments $Self Care/Home Management : 8-22 mins $Therapeutic Activity: 23-37 mins  Vincent Hogan M 10/15/2015, 7:34 PM

## 2015-10-15 NOTE — Care Management Note (Signed)
Case Management Note  Patient Details  Name: Vincent Hogan MRN: [id number] Date of Birth: 04-Jan-1966  Subjective/Objective:                    Action/Plan: Plan is for TEE on Friday. CIR recommending outpatient therapy. CM following for d/c needs.   Expected Discharge Date:                  Expected Discharge Plan:     In-House Referral:     Discharge planning Services     Post Acute Care Choice:    Choice offered to:     DME Arranged:    DME Agency:     HH Arranged:    HH Agency:     Status of Service:  In process, will continue to follow  If discussed at Long Length of Stay Meetings, dates discussed:    Additional Comments:  Vincent BaloKelli F Keria Widrig, RN 10/15/2015, 2:15 PM

## 2015-10-15 NOTE — Progress Notes (Signed)
Patient Demographics:    Vincent Hogan, is a 50 y.o. male, DOB - 12/22/1965, ZOX:[id number]  Admit date - 10/12/2015   Admitting Physician Therisa DoyneAnastassia Doutova, MD  Outpatient Primary MD for the patient is Willow OraJose Paz, MD  LOS - 2   Chief Complaint  Patient presents with  . Hypertension        Subjective:    Vincent Hogan today has no fevers, no emesis,  No chest pain,  Cognitive deficits are apparent, otherwise no new neuro deficits   Assessment  & Plan :    Active Problems:   Benign essential HTN   TIA (transient ischemic attack)   CVA (cerebral infarction)   Chronic kidney disease   Acute on chronic renal failure (HCC)   HLD (hyperlipidemia)   Cerebral infarction due to unspecified mechanism   Hx-TIA (transient ischemic attack)   Left knee injury   Acute blood loss anemia   1)Acute CVA-multiple infarcts, discussed with neurologist, continue aspirin and Lipitor, TEE on Friday, 10/16/2015, patient continues to have some cognitive deficits, no new motor deficits  2)HTN- Allow some permissive Hypertension due to acute stroke, avoid very aggressive, rapid BP control at this time.     Code Status : full   Disposition Plan  : home after TEE   Consults  :  Neuro/cards   DVT - heparin  Lab Results  Component Value Date   PLT 213 10/14/2015    Inpatient Medications  Scheduled Meds: . aspirin  300 mg Rectal Daily   Or  . aspirin  325 mg Oral Daily  . atorvastatin  80 mg Oral q1800  . heparin subcutaneous  5,000 Units Subcutaneous Q8H  . verapamil  40 mg Oral Daily   Continuous Infusions: . sodium chloride Stopped (10/15/15 0627)   PRN Meds:.acetaminophen **OR** acetaminophen    Anti-infectives    None        Objective:   Vitals:   10/15/15 0124 10/15/15 0517 10/15/15 0900 10/15/15 1605  BP: (!) 159/108 (!) 160/109 (!) 140/102 111/90  Pulse: 70 89 76 69  Resp: 18  18 20 20   Temp: 98.6 F (37 C) 98.6 F (37 C) 98.4 F (36.9 C) 98.5 F (36.9 C)  TempSrc: Oral Oral Oral Oral  SpO2: 98% 98% 97% 100%  Weight:      Height:        Wt Readings from Last 3 Encounters:  10/13/15 107.3 kg (236 lb 8 oz)  11/04/08 (!) 115.7 kg (255 lb)  10/02/08 (!) 117.9 kg (260 lb)     Intake/Output Summary (Last 24 hours) at 10/15/15 1759 Last data filed at 10/15/15 1727  Gross per 24 hour  Intake              240 ml  Output             2250 ml  Net            -2010 ml     Physical Exam  Gen:- Awake Alert,  In no apparent distress  HEENT:- Ray.AT, No sclera icterus Neck-Supple Neck,No JVD,.  Lungs-  CTAB  CV- S1, S2 normal Abd-  +ve B.Sounds, Abd Soft, No tenderness,    Extremity/Skin:- No  edema,   +  pulses NeuroPsych-cognitive deficits persist, patient is a high school teacher   Data Review:   Micro Results No results found for this or any previous visit (from the past 240 hour(s)).  Radiology Reports Dg Chest 2 View  Result Date: 10/13/2015 CLINICAL DATA:  Admission chest radiograph. Transient ischemic attack. Initial encounter. EXAM: CHEST  2 VIEW COMPARISON:  Chest radiograph performed 07/13/2005 FINDINGS: The lungs are well-aerated and clear. There is no evidence of focal opacification, pleural effusion or pneumothorax. The heart is borderline normal in size. No acute osseous abnormalities are seen. IMPRESSION: No acute cardiopulmonary process seen. Electronically Signed   By: Roanna Raider M.D.   On: 10/13/2015 01:22   Ct Head Wo Contrast  Result Date: 10/12/2015 CLINICAL DATA:  Weakness over the weekend which has since resolved. Personal history of TIAs and hypertension. EXAM: CT HEAD WITHOUT CONTRAST TECHNIQUE: Contiguous axial images were obtained from the base of the skull through the vertex without intravenous contrast. COMPARISON:  MRI of the brain 02/06/2003 FINDINGS: Moderate diffuse age advanced white matter hypoattenuation is evident  bilaterally. There is scattered hypoattenuation throughout the basal ganglia and thalami bilaterally. T2 changes extend into the corona radiata bilaterally. The insular ribbon is intact. No focal cortical defects are evident. The ventricles are proportionate to the degree of atrophy. No significant extra-axial fluid collection is present. Midline sagittal structures are within normal limits. White matter changes extend into the brainstem. The paranasal sinuses and mastoid air cells are clear. IMPRESSION: 1. Moderate diffuse age advanced white matter hypoattenuation with extension into the brainstem. The patient had significant white matter changes 13 years ago. This represents a significant progression, likely related to chronic microvascular ischemia. MRI without contrast could be used for further characterization. 2. Multiple lacunar infarcts within the basal ganglia bilaterally are age indeterminate. 3. No definite acute intracranial abnormality. Electronically Signed   By: Marin Roberts M.D.   On: 10/12/2015 23:03   [name]  Result Date: 10/13/2015 CLINICAL DATA:  TIA weakness and difficulty walking. EXAM: MRI HEAD WITHOUT CONTRAST MRA HEAD WITHOUT CONTRAST TECHNIQUE: Multiplanar, multiecho pulse sequences of the brain and surrounding structures were obtained without intravenous contrast. Angiographic images of the head were obtained using MRA technique without contrast. COMPARISON:  02/06/2003 FINDINGS: MRI HEAD FINDINGS Calvarium and upper cervical spine: Hypo intense appearance of cervical marrow is stable from 2004. Mild heterogeneity in the clivus is also stable. Question if this is related to patient's reported chronic renal failure. Orbits: Negative. Sinuses and Mastoids: Clear. Brain: Scattered subcentimeter acute white matter infarcts along the left lateral ventricle, from the frontal to occipital horns. Small acute infarct in the periatrial white matter on the right. Small left  pontine acute infarct. Small posterior left frontal and bilateral lateral temporal cortex infarcts. No superimposed acute hemorrhage. No evidence of major vessel occlusion. Small-vessel ischemic changes have developed diffusely since 2004, with numerous lacunes in the brainstem, cerebral white matter and deep grey nuclei. Progressive dilated perivascular spaces as seen with chronic hypertension. Confluent gliosis in the cerebral white matter. Brain volume is also decreased with corpus callosum thinning. There are numerous chronic hemorrhagic foci, both lobar and deep. Overall pattern suggests hypertensive hemorrhages. No hydrocephalus or mass lesion. MRA HEAD FINDINGS Symmetric carotid arteries. Left vertebral artery dominance with right vertebral artery ending in the PICA. Intact circle-of-Willis with sizable posterior communicating arteries. No major branch occlusion or proximal/treatable stenosis. No vessel beading. IMPRESSION: 1. Scattered small acute infarcts in the left more than right  cerebral white matter and cortex. Small acute infarcts in the left pons. Pattern suggests central embolic disease. 2. Severe chronic microvascular disease. 3. Negative intracranial MRA. Electronically Signed   By: Marnee Spring M.D.   On: 10/13/2015 08:52   US Renal  Result Date: 10/13/2015 CLINICAL DATA:  Chronic renal insufficiency with worsening of function over baseline; history of hypertension, previous T IA. EXAM: RENAL / URINARY TRACT ULTRASOUND COMPLETE COMPARISON:  None in PACs FINDINGS: Right Kidney: Length: 11.9 cm. The renal cortical echotexture remains lower than that of the adjacent liver. There is an exophytic midpole cyst laterally measuring 1.2 cm in greatest dimension. There is a medial mid upper pole parapelvic cyst measuring 1.3 cm in greatest dimension. There is no hydronephrosis. No stones are evident. Left Kidney: Length: 10.3 cm. The renal cortical echotexture is similar to that on the right. There  is a mid to lower pole cortical cyst measuring 1.5 cm in diameter. Bladder: The partially distended urinary bladder is unremarkable. IMPRESSION: No evidence of obstruction, cortical atrophy, or increased cortical echotexture. Simple appearing cysts bilaterally. Electronically Signed   By: David  [place] M.D.   On: 10/13/2015 09:01   Mr Maxine Glenn Head/brain ZO Cm  Result Date: 10/13/2015 CLINICAL DATA:  TIA weakness and difficulty walking. EXAM: MRI HEAD WITHOUT CONTRAST MRA HEAD WITHOUT CONTRAST TECHNIQUE: Multiplanar, multiecho pulse sequences of the brain and surrounding structures were obtained without intravenous contrast. Angiographic images of the head were obtained using MRA technique without contrast. COMPARISON:  02/06/2003 FINDINGS: MRI HEAD FINDINGS Calvarium and upper cervical spine: Hypo intense appearance of cervical marrow is stable from 2004. Mild heterogeneity in the clivus is also stable. Question if this is related to patient's reported chronic renal failure. Orbits: Negative. Sinuses and Mastoids: Clear. Brain: Scattered subcentimeter acute white matter infarcts along the left lateral ventricle, from the frontal to occipital horns. Small acute infarct in the periatrial white matter on the right. Small left pontine acute infarct. Small posterior left frontal and bilateral lateral temporal cortex infarcts. No superimposed acute hemorrhage. No evidence of major vessel occlusion. Small-vessel ischemic changes have developed diffusely since 2004, with numerous lacunes in the brainstem, cerebral white matter and deep grey nuclei. Progressive dilated perivascular spaces as seen with chronic hypertension. Confluent gliosis in the cerebral white matter. Brain volume is also decreased with corpus callosum thinning. There are numerous chronic hemorrhagic foci, both lobar and deep. Overall pattern suggests hypertensive hemorrhages. No hydrocephalus or mass lesion. MRA HEAD FINDINGS Symmetric carotid arteries.  Left vertebral artery dominance with right vertebral artery ending in the PICA. Intact circle-of-Willis with sizable posterior communicating arteries. No major branch occlusion or proximal/treatable stenosis. No vessel beading. IMPRESSION: 1. Scattered small acute infarcts in the left more than right cerebral white matter and cortex. Small acute infarcts in the left pons. Pattern suggests central embolic disease. 2. Severe chronic microvascular disease. 3. Negative intracranial MRA. Electronically Signed   By: Marnee Spring M.D.   On: 10/13/2015 08:52     CBC  Recent Labs Lab 10/12/15 1748 10/13/15 1049 10/14/15 0606  WBC 4.1 3.9* 4.1  HGB 13.0 12.0* 11.5*  HCT 37.6* 36.5* 34.5*  PLT 242 228 213  MCV 76.6* 79.2 78.2  MCH 26.5 26.0 26.1  MCHC 34.6 32.9 33.3  RDW 13.1 13.1 12.9    Chemistries   Recent Labs Lab 10/12/15 1748 10/13/15 1049 10/14/15 0606  NA 137 137 136  K 4.2 4.6 4.8  CL 104 104 107  CO2 24 24  21*  GLUCOSE 97 137* 109*  BUN 38* 30* 27*  CREATININE 1.94* 1.77* 1.65*  CALCIUM 9.5 9.6 9.4   ------------------------------------------------------------------------------------------------------------------  Recent Labs  10/13/15 0516  CHOL 157  HDL 31*  LDLCALC 110*  TRIG 82  CHOLHDL 5.1    Lab Results  Component Value Date   HGBA1C 5.8 (H) 10/13/2015   ------------------------------------------------------------------------------------------------------------------ No results for input(s): TSH, T4TOTAL, T3FREE, THYROIDAB in the last 72 hours.  Invalid input(s): FREET3 ------------------------------------------------------------------------------------------------------------------ No results for input(s): VITAMINB12, FOLATE, FERRITIN, TIBC, IRON, RETICCTPCT in the last 72 hours.  Coagulation profile No results for input(s): INR, PROTIME in the last 168 hours.  No results for input(s): DDIMER in the last 72 hours.  Cardiac Enzymes  Recent  Labs Lab 10/13/15 0516  TROPONINI 0.05*   ------------------------------------------------------------------------------------------------------------------ No results found for: BNP   Halen Mossbarger M.D on 10/15/2015 at 5:59 PM  Between 7am to 7pm - Pager - [phone]  After 7pm go to [email or web address] - password [email or web address]  [facility] -  Office  [phone]  Dragon dictation system was used to create this note, attempts have been made to correct errors, however presence of uncorrected errors is not a reflection quality of care provided

## 2015-10-15 NOTE — Progress Notes (Signed)
STROKE TEAM PROGRESS NOTE   SUBJECTIVE (INTERVAL HISTORY) Patient sitting up on edge of bed. "it is ok you have to keep me, I am good." gave thumbs up. Looking forward to discharge tomorrow.   OBJECTIVE Temp:  [98.1 F (36.7 C)-98.6 F (37 C)] 98.4 F (36.9 C) (08/17 0900) Pulse Rate:  [65-89] 76 (08/17 0900) Cardiac Rhythm: Heart block (08/17 0700) Resp:  [15-20] 20 (08/17 0900) BP: (140-160)/(87-109) 140/102 (08/17 0900) SpO2:  [97 %-99 %] 97 % (08/17 0900)  CBC:   Recent Labs Lab 10/13/15 1049 10/14/15 0606  WBC 3.9* 4.1  HGB 12.0* 11.5*  HCT 36.5* 34.5*  MCV 79.2 78.2  PLT 228 213    Basic Metabolic Panel:   Recent Labs Lab 10/13/15 1049 10/14/15 0606  NA 137 136  K 4.6 4.8  CL 104 107  CO2 24 21*  GLUCOSE 137* 109*  BUN 30* 27*  CREATININE 1.77* 1.65*  CALCIUM 9.6 9.4    Lipid Panel:     Component Value Date/Time   CHOL 157 10/13/2015 0516   TRIG 82 10/13/2015 0516   HDL 31 (L) 10/13/2015 0516   CHOLHDL 5.1 10/13/2015 0516   VLDL 16 10/13/2015 0516   LDLCALC 110 (H) 10/13/2015 0516   HgbA1c:  Lab Results  Component Value Date   HGBA1C 5.8 (H) 10/13/2015   Urine Drug Screen:     Component Value Date/Time   LABOPIA NONE DETECTED 10/13/2015 0030   COCAINSCRNUR NONE DETECTED 10/13/2015 0030   LABBENZ NONE DETECTED 10/13/2015 0030   AMPHETMU NONE DETECTED 10/13/2015 0030   THCU NONE DETECTED 10/13/2015 0030   LABBARB NONE DETECTED 10/13/2015 0030      IMAGING I have personally reviewed the radiological images below and agree with the radiology interpretations.  Ct Head Wo Contrast 10/12/2015 1. Moderate diffuse age advanced white matter hypoattenuation with extension into the brainstem. The patient had significant white matter changes 13 years ago. This represents a significant progression, likely related to chronic microvascular ischemia. MRI without contrast could be used for further characterization. 2. Multiple lacunar infarcts within  the basal ganglia bilaterally are age indeterminate. 3. No definite acute intracranial abnormality.  [name] Mr Maxine GlennMra Head/brain Wo Cm 10/13/2015 1. Scattered small acute infarcts in the left more than right cerebral white matter and cortex. Small acute infarcts in the left pons. Pattern suggests central embolic disease. 2. Severe chronic microvascular disease. 3. Negative intracranial MRA.   Dg Chest 2 View 10/13/2015 No acute cardiopulmonary process seen.   [place] Renal 10/13/2015 No evidence of obstruction, cortical atrophy, or increased cortical echotexture. Simple appearing cysts bilaterally.  Carotid Doppler There is 1-39% bilateral ICA stenosis. Vertebral artery flow is antegrade.    2-D echocardiogram - pending   TCD bubble study - negative for PFO  TCD MES - negative for emboli   TEE pending    PHYSICAL EXAM General - Well nourished, well developed, in no apparent distress.  Ophthalmologic - Sharp disc margins OU.   Cardiovascular - Regular rate and rhythm.  Mental Status -  Level of arousal and orientation to time, place, and person were intact. Language including expression, naming, repetition, comprehension was assessed and found intact. Mildly slow mental processing.  Cranial Nerves II - XII - II - Visual field intact OU. III, IV, VI - Extraocular movements intact. V - Facial sensation intact bilaterally. VII - Facial movement intact bilaterally. VIII - Hearing & vestibular intact bilaterally. X - Palate elevates symmetrically. XI -  Chin turning & shoulder shrug intact bilaterally. XII - Tongue protrusion intact.  Motor Strength - The patient's strength was normal in all extremities and pronator drift was absent.  Bulk was normal and fasciculations were absent.   Motor Tone - Muscle tone was assessed at the neck and appendages and was normal.  Reflexes - The patient's reflexes were 1+ in all extremities and he had no pathological  reflexes.  Sensory - Light touch, temperature/pinprick were assessed and were symmetrical.    Coordination - The patient had normal movements in the hands with no ataxia or dysmetria.  Tremor was absent.  Gait and Station - deferred.   ASSESSMENT/PLAN [name] is a 50 y.o. male with history of TIAs and poorly controlled blood pressure presenting with L sided weakness. He did not receive IV t-PA due to delay in arrival.   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 Arranged with [facility] for Friday (this is first opening available).   If TEE negative, [facility] will consult and consider placement of an implantable loop recorder to evaluate for atrial fibrillation as etiology of stroke. This has been explained to patient/family by Dr. Roda ShuttersXu and they are agreeable.   LDL 110  HgbA1c 5.8  SCDs for VTE prophylaxis Diet Heart Room service appropriate? Yes; Fluid consistency: Thin  aspirin 81 mg daily prior to admission, now on aspirin 325 mg daily. Continue ASA on discharge.  Patient counseled to be compliant with his antithrombotic medications  Ongoing aggressive stroke risk factor management  Therapy recommendations:  OP OT, SLP & PT based on progress. Rehab admissions coordinator is following  Disposition:  pending  Hypertension  Stable Permissive hypertension (OK if < 220/120) but gradually normalize in 5-7 days Long-term BP goal normotensive  Hyperlipidemia  Home meds:  No  statin  LDL 110, goal < 70  Add lipitor 80mg   Continue statin at discharge  Other Stroke Risk Factors  Overweight, Body mass index is 28.04 kg/m., recommend weight loss, diet and exercise as appropriate   Hx stroke vs TIA - patient cannot provide any specific details about when this occurred and where his symptoms were except for a possible left facial droop  Family hx stroke (father, other)  Other Active Problems  Acute on Chronic Kidney disease -  Cre 1.94  Hospital day # 2  Marvel PlanJindong Sherree Shankman, MD PhD Stroke Neurology 10/15/2015 5:17 PM     To contact Stroke Continuity provider, please refer to [email or web address]. After hours, contact General Neurology

## 2015-10-15 NOTE — Progress Notes (Signed)
Physical Therapy Treatment Patient Details Name: Vincent RavelingWesley D Sossamon MRN: [id number] DOB: 06/23/1965 Today's Date: 10/15/2015    History of Present Illness [name] Spenceris a 50 y.o.malewith medical history significant ofTIAs in the past on poorly controlled hypertension. Pt admitted with L sided weakness and fatigue. 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    PT Comments    Patient limited due to pain in L foot, but feel could go home with intermittent assist with walker and follow up outpatient for multidisciplinary rehab.  Would need initial 24 hour care due to cognitive deficits, if not available may need to consider STSNF.  Follow Up Recommendations  Outpatient PT;Supervision for mobility/OOB     Equipment Recommendations  Rolling walker with 5" wheels    Recommendations for Other Services       Precautions / Restrictions Precautions Precautions: Fall    Mobility  Bed Mobility               General bed mobility comments: in chair on arrival  Transfers Overall transfer level: Needs assistance   Transfers: Sit to/from Stand Sit to Stand: Min guard         General transfer comment: LOB x 1 when standing after sitting back to don shoes due to L UE slipping off arm of chair, very dependent on UE support  Ambulation/Gait Ambulation/Gait assistance: Min guard;Supervision Ambulation Distance (Feet): 200 Feet Assistive device: Rolling walker (2 wheeled) Gait Pattern/deviations: Step-through pattern;Antalgic;Decreased stance time - left     General Gait Details: Initially unsteady with out walker due to painful L foot,    Stairs            Wheelchair Mobility    Modified Rankin (Stroke Patients Only) Modified Rankin (Stroke Patients Only) Pre-Morbid Rankin Score: No symptoms Modified Rankin: Moderately severe disability     Balance               Standing balance comment: deferred balance work due to pain in L foot                     Cognition Arousal/Alertness: Awake/alert Behavior During Therapy: Impulsive Overall Cognitive Status: Impaired/Different from baseline Area of Impairment: Safety/judgement;Problem solving;Following commands     Memory: Decreased short-term memory Following Commands: Follows multi-step commands inconsistently Safety/Judgement: Decreased awareness of safety;Decreased awareness of deficits   Problem Solving: Slow processing;Requires verbal cues      Exercises      General Comments        Pertinent Vitals/Pain Pain Assessment: 0-10 Pain Score: 6  Pain Location: L foot on plantar aspect Pain Descriptors / Indicators: Discomfort;Dull Pain Intervention(s): Monitored during session;Ice applied;Repositioned    Home Living                      Prior Function            PT Goals (current goals can now be found in the care plan section) Progress towards PT goals: Not progressing toward goals - comment (pain in L foot)    Frequency  Min 4X/week    PT Plan Discharge plan needs to be updated    Co-evaluation             End of Session Equipment Utilized During Treatment: Gait belt Activity Tolerance: Patient limited by pain Patient left: in chair;with call bell/phone within reach;with chair alarm set     Time: [id number] PT Time Calculation (min) (ACUTE ONLY): 24  min  Charges:  $Gait Training: 23-37 mins                    G Codes:      Elray McgregorCynthia Winter Trefz 10/15/2015, 5:18 PM  Garnettyndi Gaetana Kawahara, [place] CarolinaPT [phone] 10/15/2015

## 2015-10-15 NOTE — Progress Notes (Signed)
Speech Language Pathology Treatment: Cognitive-Linquistic  Patient Details Name: Earlie RavelingWesley D Thalman MRN: [id number] DOB: [birth year] Today's Date: 10/15/2015 Time: 1350-1420 SLP Time Calculation (min) (ACUTE ONLY): 30 min  Assessment / Plan / Recommendation Clinical Impression  Pt seen for cognitive-linguistic followup. Language is generally [facility] for basic communication, however pt is demonstrating moderate impairment as related to working memory, problem solving and awareness. 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 Discussed recommendation for no immediate return to work as he is not demonstrating the cognitive ability currently to manage those demands. Discussed 24/7 supervision rec with pt and case management. Pt verbalized understanding, but would benefit from reinforcement.     HPI HPI: [name] is a 50 y.o. male with medical history significant of TIAs in the past on poorly controlled hypertension. He presented with reports of varying left sided weakness for 3 days.  MRI revealed scattered small acute infarcts in the left more than right cerebral white matter and cortex. Small acute infarcts in the left pons. Orders received for cognitive-linguistic evaluation.        SLP Plan  Continue with current plan of care     Recommendations                Follow up Recommendations: Outpatient SLP Plan: Continue with current plan of care     GO                Rocky CraftsKara E Naveya Ellerman MA, CCC-SLP Pager [phone] 10/15/2015, 2:27 PM

## 2015-10-16 ENCOUNTER — Encounter (HOSPITAL_COMMUNITY)
Admission: EM | Disposition: A | Discharge status: Home / Self Care | Payer: Self-pay | Source: Home / Self Care | Attending: Family Medicine

## 2015-10-16 ENCOUNTER — Other Ambulatory Visit (HOSPITAL_COMMUNITY): Payer: BC Managed Care – PPO

## 2015-10-16 ENCOUNTER — Inpatient Hospital Stay (HOSPITAL_COMMUNITY): Payer: BC Managed Care – PPO

## 2015-10-16 DIAGNOSIS — I351 Nonrheumatic aortic (valve) insufficiency: Secondary | ICD-10-CM

## 2015-10-16 HISTORY — PX: TEE WITHOUT CARDIOVERSION: SHX5443

## 2015-10-16 SURGERY — LOOP RECORDER INSERTION

## 2015-10-16 SURGERY — ECHOCARDIOGRAM, TRANSESOPHAGEAL
Anesthesia: Moderate Sedation

## 2015-10-16 MED ORDER — HYDRALAZINE HCL 20 MG/ML IJ SOLN
INTRAMUSCULAR | Status: AC
Start: 2015-10-16 — End: 2015-10-16
  Filled 2015-10-16: qty 1

## 2015-10-16 MED ORDER — FENTANYL CITRATE (PF) 100 MCG/2ML IJ SOLN
INTRAMUSCULAR | Status: AC
  Filled 2015-10-16: qty 2

## 2015-10-16 MED ORDER — ATORVASTATIN CALCIUM 80 MG PO TABS: 80.0000 mg | ORAL_TABLET | Freq: Every day | ORAL | 1 refills | Status: AC

## 2015-10-16 MED ORDER — BUTAMBEN-TETRACAINE-BENZOCAINE 2-2-14 % EX AERO
INHALATION_SPRAY | CUTANEOUS | Status: DC | PRN
  Administered 2015-10-16: 2 via TOPICAL

## 2015-10-16 MED ORDER — MIDAZOLAM HCL 5 MG/ML IJ SOLN
INTRAMUSCULAR | Status: AC
  Filled 2015-10-16: qty 2

## 2015-10-16 MED ORDER — FENTANYL CITRATE (PF) 100 MCG/2ML IJ SOLN
INTRAMUSCULAR | Status: DC | PRN
  Administered 2015-10-16 (×2): 25 ug via INTRAVENOUS

## 2015-10-16 MED ORDER — MIDAZOLAM HCL 10 MG/2ML IJ SOLN
INTRAMUSCULAR | Status: DC | PRN
  Administered 2015-10-16: 2 mg via INTRAVENOUS
  Administered 2015-10-16: 1 mg via INTRAVENOUS
  Administered 2015-10-16: 2 mg via INTRAVENOUS

## 2015-10-16 MED ORDER — DIPHENHYDRAMINE HCL 50 MG/ML IJ SOLN
INTRAMUSCULAR | Status: AC
  Filled 2015-10-16: qty 1

## 2015-10-16 MED ORDER — HYDRALAZINE HCL 20 MG/ML IJ SOLN
10.0000 mg | Freq: Once | INTRAMUSCULAR | Status: AC
  Administered 2015-10-16: 10 mg via INTRAVENOUS

## 2015-10-16 MED ORDER — ASPIRIN 325 MG PO TABS: 325.0000 mg | ORAL_TABLET | Freq: Every day | ORAL | 2 refills | Status: DC

## 2015-10-16 NOTE — CV Procedure (Signed)
    TEE  LV - moderate LVH, normal EF, 60% MV - mild MR TR - no TR LAA- no thrombus   Normal bubble study  HTN noted. No embolic source identified.   Donato SchultzMark Skains, MD

## 2015-10-16 NOTE — Consult Note (Signed)
ELECTROPHYSIOLOGY CONSULT NOTE  Patient ID: Vincent Hogan MRN: [id number], DOB/AGE: [birth year]   Admit date: 10/12/2015 Date of Consult: 10/16/2015  Primary Physician: Willow Ora, MD Primary Cardiologist: none Reason for Consultation: Cryptogenic stroke - recommendations regarding Implantable Loop Recorder Requesting MD: Dr. Roda Shutters  History of Present Illness LANNIE YUSUF was admitted on 10/12/2015 with vague symptoms of weakness, possibly left sided more specifically requiring cane of late to ambulate and some word finiding difficulty.  PMHx includes HTN, TIA, gout, CBP, arthritis They first developed symptoms while it seems over a few days.  Imaging demonstrated scattered L>R cortical and subcortical cerebral infarct and mild left pontine infarcts, infarcts embolic secondary to unknown source.  he has undergone workup for stroke including echocardiogram and carotid dopplers.  The patient has been monitored on telemetry which has demonstrated sinus rhythm with no arrhythmias.  Inpatient stroke work-up is to be completed with a TEE.   Echocardiogram this admission demonstrated  pending  Lab work is reviewed.  Prior to admission, the patient denies chest pain, shortness of breath, dizziness, palpitations, or syncope.  They are recovering from their stroke with disposition pending at discharge.  EP has been asked to evaluate for placement of an implantable loop recorder to monitor for atrial fibrillation.   Past Medical History:  Diagnosis Date  . Hypertension   . TIA (transient ischemic attack)      Surgical History: History reviewed. No pertinent surgical history.   Prescriptions Prior to Admission  Medication Sig Dispense Refill Last Dose  . aspirin EC 81 MG tablet Take 81 mg by mouth daily.   Past Week at Unknown time  . hydrochlorothiazide (HYDRODIURIL) 25 MG tablet Take 25 mg by mouth daily.   Past Week at Unknown time  . lisinopril (PRINIVIL,ZESTRIL) 20 MG tablet Take 20  mg by mouth daily.   Past Week at Unknown time  . verapamil (CALAN) 40 MG tablet Take 40 mg by mouth daily.   Past Week at Unknown time    Inpatient Medications:  . aspirin  300 mg Rectal Daily   Or  . aspirin  325 mg Oral Daily  . atorvastatin  80 mg Oral q1800  . heparin subcutaneous  5,000 Units Subcutaneous Q8H  . verapamil  40 mg Oral Daily    Allergies:  Allergies  Allergen Reactions  . Lisinopril     REACTION: cough    Social History   Social History  . Marital status: Single    Spouse name: N/A  . Number of children: N/A  . Years of education: N/A   Occupational History  . Not on file.   Social History Main Topics  . Smoking status: Never Smoker  . Smokeless tobacco: Never Used  . Alcohol use No  . Drug use: No  . Sexual activity: Not on file   Other Topics Concern  . Not on file   Social History Narrative  . No narrative on file     Family History  Problem Relation Age of Onset  . Dementia Mother   . Stroke Father   . Hypertension Father   . Hypertension Sister   . Hypertension Brother   . Stroke Other   . CAD Neg Hx   . Cancer Neg Hx       Review of Systems: All other systems reviewed and are otherwise negative except as noted above.  Physical Exam: Vitals:   10/15/15 1908 10/16/15 0203 10/16/15 0553 10/16/15 1000  BP: Marland Kitchen)  154/99 (!) 146/93 (!) 142/95 (!) 154/95  Pulse: 69 76 77 64  Resp: 20 20 18 18   Temp: 98.6 F (37 C) 98.2 F (36.8 C) 98.9 F (37.2 C) 98.7 F (37.1 C)  TempSrc: Oral Oral Oral Oral  SpO2: 100% 100% 100% 100%  Weight:      Height:        GEN- The patient is well appearing, alert and oriented x 3 today.   Head- normocephalic, atraumatic Eyes-  Sclera clear, conjunctiva pink Ears- hearing intact Oropharynx- clear Neck- supple Lungs- Clear to ausculation bilaterally, normal work of breathing Heart- Regular rate and rhythm, no murmurs, rubs or gallops  GI- soft, NT, ND Extremities- no clubbing, cyanosis,  or edema MS- no significant deformity or atrophy Skin- no rash or lesion Psych- euthymic mood, full affect   Labs:   Lab Results  Component Value Date   WBC 4.1 10/14/2015   HGB 11.5 (L) 10/14/2015   HCT 34.5 (L) 10/14/2015   MCV 78.2 10/14/2015   PLT 213 10/14/2015    Recent Labs Lab 10/14/15 0606  NA 136  K 4.8  CL 107  CO2 21*  BUN 27*  CREATININE 1.65*  CALCIUM 9.4  GLUCOSE 109*   Lab Results  Component Value Date   TROPONINI 0.05 (HH) 10/13/2015   Lab Results  Component Value Date   CHOL 157 10/13/2015   CHOL 198 12/08/2006   Lab Results  Component Value Date   HDL 31 (L) 10/13/2015   HDL 33.6 (L) 12/08/2006   Lab Results  Component Value Date   LDLCALC 110 (H) 10/13/2015   LDLCALC 136 (H) 12/08/2006   Lab Results  Component Value Date   TRIG 82 10/13/2015   TRIG 143 12/08/2006   Lab Results  Component Value Date   CHOLHDL 5.1 10/13/2015   CHOLHDL 5.9 CALC 12/08/2006   No results found for: LDLDIRECT  No results found for: DDIMER   Radiology/Studies:  Dg Chest 2 View Result Date: 10/13/2015 CLINICAL DATA:  Admission chest radiograph. Transient ischemic attack. Initial encounter. EXAM: CHEST  2 VIEW COMPARISON:  Chest radiograph performed 07/13/2005 FINDINGS: The lungs are well-aerated and clear. There is no evidence of focal opacification, pleural effusion or pneumothorax. The heart is borderline normal in size. No acute osseous abnormalities are seen. IMPRESSION: No acute cardiopulmonary process seen. Electronically Signed   By: Roanna RaiderJeffery  Chang M.D.   On: 10/13/2015 01:22   Ct Head Wo Contrast Result Date: 10/12/2015 CLINICAL DATA:  Weakness over the weekend which has since resolved. Personal history of TIAs and hypertension. EXAM: CT HEAD WITHOUT CONTRAST TECHNIQUE: Contiguous axial images were obtained from the base of the skull through the vertex without intravenous contrast. COMPARISON:  MRI of the brain 02/06/2003 FINDINGS: Moderate diffuse  age advanced white matter hypoattenuation is evident bilaterally. There is scattered hypoattenuation throughout the basal ganglia and thalami bilaterally. T2 changes extend into the corona radiata bilaterally. The insular ribbon is intact. No focal cortical defects are evident. The ventricles are proportionate to the degree of atrophy. No significant extra-axial fluid collection is present. Midline sagittal structures are within normal limits. White matter changes extend into the brainstem. The paranasal sinuses and mastoid air cells are clear. IMPRESSION: 1. Moderate diffuse age advanced white matter hypoattenuation with extension into the brainstem. The patient had significant white matter changes 13 years ago. This represents a significant progression, likely related to chronic microvascular ischemia. MRI without contrast could be used for further characterization. 2. Multiple  lacunar infarcts within the basal ganglia bilaterally are age indeterminate. 3. No definite acute intracranial abnormality. Electronically Signed   By: Marin Robertshristopher  Mattern M.D.   On: 10/12/2015 23:03   [name] Result Date: 10/13/2015 CLINICAL DATA:  TIA weakness and difficulty walking. EXAM: MRI HEAD WITHOUT CONTRAST MRA HEAD WITHOUT CONTRAST TECHNIQUE: Multiplanar, multiecho pulse sequences of the brain and surrounding structures were obtained without intravenous contrast. Angiographic images of the head were obtained using MRA technique without contrast. COMPARISON:  02/06/2003 FINDINGS: MRI HEAD FINDINGS Calvarium and upper cervical spine: Hypo intense appearance of cervical marrow is stable from 2004. Mild heterogeneity in the clivus is also stable. Question if this is related to patient's reported chronic renal failure. Orbits: Negative. Sinuses and Mastoids: Clear. Brain: Scattered subcentimeter acute white matter infarcts along the left lateral ventricle, from the frontal to occipital horns. Small acute infarct in  the periatrial white matter on the right. Small left pontine acute infarct. Small posterior left frontal and bilateral lateral temporal cortex infarcts. No superimposed acute hemorrhage. No evidence of major vessel occlusion. Small-vessel ischemic changes have developed diffusely since 2004, with numerous lacunes in the brainstem, cerebral white matter and deep grey nuclei. Progressive dilated perivascular spaces as seen with chronic hypertension. Confluent gliosis in the cerebral white matter. Brain volume is also decreased with corpus callosum thinning. There are numerous chronic hemorrhagic foci, both lobar and deep. Overall pattern suggests hypertensive hemorrhages. No hydrocephalus or mass lesion. MRA HEAD FINDINGS Symmetric carotid arteries. Left vertebral artery dominance with right vertebral artery ending in the PICA. Intact circle-of-Willis with sizable posterior communicating arteries. No major branch occlusion or proximal/treatable stenosis. No vessel beading. IMPRESSION: 1. Scattered small acute infarcts in the left more than right cerebral white matter and cortex. Small acute infarcts in the left pons. Pattern suggests central embolic disease. 2. Severe chronic microvascular disease. 3. Negative intracranial MRA. Electronically Signed   By: Marnee SpringJonathon  Watts M.D.   On: 10/13/2015 08:52     12-lead ECG SR All prior EKG's in EPIC reviewed with no documented atrial fibrillation Telemetry SR only  Assessment and Plan:  1. Cryptogenic stroke The patient presents with cryptogenic stroke.  The patient has a TEE planned for this afternoon.  I spoke at length with the patient about monitoring for afib with either a 30 day event monitor or an implantable loop recorder.  Risks, benefits, and alteratives to implantable loop recorder were discussed with the patient today.   At this time, the patient would like to hold off any kind of monitoring.  Should he decide to do anything said it would likely be the  loop, but he would like to give it some thought, go home, and discuss further with family and maybe pursue it out patient.     Renee Norberto SorensonLynn Ursuy, PA-C 10/16/2015  I have seen, examined the patient, and reviewed the above assessment and plan.  Changes to above are made where necessary.   I have spoken with the patient.  He is clear in his decision to decline outpatient monitoring.  He can follow-up with Dr Roda ShuttersXu in the office for further discussions.  Electrophysiology team to see as needed while here. Please call with questions.   Co Sign: Hillis RangeJames Reinhardt Licausi, MD 10/16/2015 3:25 PM

## 2015-10-16 NOTE — Progress Notes (Signed)
    Excuse from work from 10/13/15 thru 11/02/15 inclusive.     Use caution when using heavy equipment or power tools. Avoid working on ladders or at heights. Take showers instead of baths.   Return to work only after 11/03/2015 if cleared by PCP upon reevaluation

## 2015-10-16 NOTE — Progress Notes (Signed)
STROKE TEAM PROGRESS NOTE   SUBJECTIVE (INTERVAL HISTORY) Brothers at bedside. TEE done unremarkable. He refused loop recorder, 30 day cardiac monitoring at this time. He would like to think about it at home and let us know.   OBJECTIVE Temp:  [98.1 F (36.7 C)-98.9 F (37.2 C)] 98.2 F (36.8 C) (08/18 1338) Pulse Rate:  [64-109] 86 (08/18 1338) Cardiac Rhythm: Heart block (08/18 0817) Resp:  [8-24] 18 (08/18 1338) BP: (137-200)/(93-147) 137/103 (08/18 1338) SpO2:  [97 %-100 %] 100 % (08/18 1338)  CBC:   Recent Labs Lab 10/13/15 1049 10/14/15 0606  WBC 3.9* 4.1  HGB 12.0* 11.5*  HCT 36.5* 34.5*  MCV 79.2 78.2  PLT 228 213    Basic Metabolic Panel:   Recent Labs Lab 10/13/15 1049 10/14/15 0606  NA 137 136  K 4.6 4.8  CL 104 107  CO2 24 21*  GLUCOSE 137* 109*  BUN 30* 27*  CREATININE 1.77* 1.65*  CALCIUM 9.6 9.4    Lipid Panel:     Component Value Date/Time   CHOL 157 10/13/2015 0516   TRIG 82 10/13/2015 0516   HDL 31 (L) 10/13/2015 0516   CHOLHDL 5.1 10/13/2015 0516   VLDL 16 10/13/2015 0516   LDLCALC 110 (H) 10/13/2015 0516   HgbA1c:  Lab Results  Component Value Date   HGBA1C 5.8 (H) 10/13/2015   Urine Drug Screen:     Component Value Date/Time   LABOPIA NONE DETECTED 10/13/2015 0030   COCAINSCRNUR NONE DETECTED 10/13/2015 0030   LABBENZ NONE DETECTED 10/13/2015 0030   AMPHETMU NONE DETECTED 10/13/2015 0030   THCU NONE DETECTED 10/13/2015 0030   LABBARB NONE DETECTED 10/13/2015 0030      IMAGING I have personally reviewed the radiological images below and agree with the radiology interpretations.  Ct Head Wo Contrast 10/12/2015 1. Moderate diffuse age advanced white matter hypoattenuation with extension into the brainstem. The patient had significant white matter changes 13 years ago. This represents a significant progression, likely related to chronic microvascular ischemia. MRI without contrast could be used for further characterization.  2. Multiple lacunar infarcts within the basal ganglia bilaterally are age indeterminate. 3. No definite acute intracranial abnormality.  [name] Mr Maxine GlennMra Head/brain Wo Cm 10/13/2015 1. Scattered small acute infarcts in the left more than right cerebral white matter and cortex. Small acute infarcts in the left pons. Pattern suggests central embolic disease. 2. Severe chronic microvascular disease. 3. Negative intracranial MRA.   Dg Chest 2 View 10/13/2015 No acute cardiopulmonary process seen.   [place] Renal 10/13/2015 No evidence of obstruction, cortical atrophy, or increased cortical echotexture. Simple appearing cysts bilaterally.  Carotid Doppler There is 1-39% bilateral ICA stenosis. Vertebral artery flow is antegrade.    TCD bubble study - negative for PFO  TCD MES - negative for emboli   TEE  LV - moderate LVH, normal EF, 60% MV - mild MR TR - no TR LAA- no thrombus  Normal bubble study HTN noted. No embolic source identified.     PHYSICAL EXAM General - Well nourished, well developed, in no apparent distress.  Ophthalmologic - Sharp disc margins OU.   Cardiovascular - Regular rate and rhythm.  Mental Status -  Level of arousal and orientation to time, place, and person were intact. Language including expression, naming, repetition, comprehension was assessed and found intact. Mildly slow mental processing.  Cranial Nerves II - XII - II - Visual field intact OU. III, IV, VI - Extraocular movements  intact. V - Facial sensation intact bilaterally. VII - Facial movement intact bilaterally. VIII - Hearing & vestibular intact bilaterally. X - Palate elevates symmetrically. XI - Chin turning & shoulder shrug intact bilaterally. XII - Tongue protrusion intact.  Motor Strength - The patient's strength was normal in all extremities and pronator drift was absent.  Bulk was normal and fasciculations were absent.   Motor Tone - Muscle tone was assessed at the  neck and appendages and was normal.  Reflexes - The patient's reflexes were 1+ in all extremities and he had no pathological reflexes.  Sensory - Light touch, temperature/pinprick were assessed and were symmetrical.    Coordination - The patient had normal movements in the hands with no ataxia or dysmetria.  Tremor was absent.  Gait and Station - deferred.   ASSESSMENT/PLAN [name] is a 50 y.o. male with history of TIAs and poorly controlled blood pressure presenting with L sided weakness. He did not receive IV t-PA due to delay in arrival.   Stroke:  scattered L>R cortical and subcortical cerebral infarct and mild left pontine infarcts, infarcts embolic secondary to unknown source  Resultant  Deficit resolved  MRI  Scattered small L > R cortical and subcortical cerebral infarcts, small left pontine infarcts  MRA  negative  Carotid Doppler no significant stenosis  TEE unremarkable  TCD bubble study negative for PFO   TCD MES negative for emboli   Pt refused loop and 30 day monitoring so far.  LDL 110  HgbA1c 5.8  SCDs for VTE prophylaxis Diet Heart Room service appropriate? Yes; Fluid consistency: Thin Diet - low sodium heart healthy  aspirin 81 mg daily prior to admission, now on aspirin 325 mg daily. Continue ASA on discharge.  Patient counseled to be compliant with his antithrombotic medications  Ongoing aggressive stroke risk factor management  Therapy recommendations:  OP OT, SLP & PT based on progress.   Disposition:  home  Hypertension  Stable Permissive hypertension (OK if < 220/120) but gradually normalize in 5-7 days Long-term BP goal normotensive  Hyperlipidemia  Home meds:  No statin  LDL 110, goal < 70  Add lipitor 80mg   Continue statin at discharge   Other Stroke Risk Factors  Overweight, Body mass index is 28.04 kg/m., recommend weight loss, diet and exercise as appropriate   Hx stroke vs TIA - patient cannot provide  any specific details about when this occurred and where his symptoms were except for a possible left facial droop  Family hx stroke (father, other)  Other Active Problems  Acute on Chronic Kidney disease -  Cre 1.94  Hospital day # 3  Neurology will sign off. Please call with questions. [facility]. Thanks for the consult.  Marvel PlanJindong Yan Pankratz, MD PhD Stroke Neurology 10/16/2015 5:42 PM     To contact Stroke Continuity provider, please refer to [email or web address]. After hours, contact General Neurology

## 2015-10-16 NOTE — H&P (View-Only) (Signed)
Patient Demographics:    Vincent Hogan, is a 50 y.o. male, DOB - 12/22/1965, ZOX:[id number]  Admit date - 10/12/2015   Admitting Physician Therisa DoyneAnastassia Doutova, MD  Outpatient Primary MD for the patient is Willow OraJose Paz, MD  LOS - 2   Chief Complaint  Patient presents with  . Hypertension        Subjective:    Vincent Hogan today has no fevers, no emesis,  No chest pain,  Cognitive deficits are apparent, otherwise no new neuro deficits   Assessment  & Plan :    Active Problems:   Benign essential HTN   TIA (transient ischemic attack)   CVA (cerebral infarction)   Chronic kidney disease   Acute on chronic renal failure (HCC)   HLD (hyperlipidemia)   Cerebral infarction due to unspecified mechanism   Hx-TIA (transient ischemic attack)   Left knee injury   Acute blood loss anemia   1)Acute CVA-multiple infarcts, discussed with neurologist, continue aspirin and Lipitor, TEE on Friday, 10/16/2015, patient continues to have some cognitive deficits, no new motor deficits  2)HTN- Allow some permissive Hypertension due to acute stroke, avoid very aggressive, rapid BP control at this time.     Code Status : full   Disposition Plan  : home after TEE   Consults  :  Neuro/cards   DVT - heparin  Lab Results  Component Value Date   PLT 213 10/14/2015    Inpatient Medications  Scheduled Meds: . aspirin  300 mg Rectal Daily   Or  . aspirin  325 mg Oral Daily  . atorvastatin  80 mg Oral q1800  . heparin subcutaneous  5,000 Units Subcutaneous Q8H  . verapamil  40 mg Oral Daily   Continuous Infusions: . sodium chloride Stopped (10/15/15 0627)   PRN Meds:.acetaminophen **OR** acetaminophen    Anti-infectives    None        Objective:   Vitals:   10/15/15 0124 10/15/15 0517 10/15/15 0900 10/15/15 1605  BP: (!) 159/108 (!) 160/109 (!) 140/102 111/90  Pulse: 70 89 76 69  Resp: 18  18 20 20   Temp: 98.6 F (37 C) 98.6 F (37 C) 98.4 F (36.9 C) 98.5 F (36.9 C)  TempSrc: Oral Oral Oral Oral  SpO2: 98% 98% 97% 100%  Weight:      Height:        Wt Readings from Last 3 Encounters:  10/13/15 107.3 kg (236 lb 8 oz)  11/04/08 (!) 115.7 kg (255 lb)  10/02/08 (!) 117.9 kg (260 lb)     Intake/Output Summary (Last 24 hours) at 10/15/15 1759 Last data filed at 10/15/15 1727  Gross per 24 hour  Intake              240 ml  Output             2250 ml  Net            -2010 ml     Physical Exam  Gen:- Awake Alert,  In no apparent distress  HEENT:- Ray.AT, No sclera icterus Neck-Supple Neck,No JVD,.  Lungs-  CTAB  CV- S1, S2 normal Abd-  +ve B.Sounds, Abd Soft, No tenderness,    Extremity/Skin:- No  edema,   +  pulses NeuroPsych-cognitive deficits persist, patient is a high school teacher   Data Review:   Micro Results No results found for this or any previous visit (from the past 240 hour(s)).  Radiology Reports Dg Chest 2 View  Result Date: 10/13/2015 CLINICAL DATA:  Admission chest radiograph. Transient ischemic attack. Initial encounter. EXAM: CHEST  2 VIEW COMPARISON:  Chest radiograph performed 07/13/2005 FINDINGS: The lungs are well-aerated and clear. There is no evidence of focal opacification, pleural effusion or pneumothorax. The heart is borderline normal in size. No acute osseous abnormalities are seen. IMPRESSION: No acute cardiopulmonary process seen. Electronically Signed   By: Roanna Raider M.D.   On: 10/13/2015 01:22   Ct Head Wo Contrast  Result Date: 10/12/2015 CLINICAL DATA:  Weakness over the weekend which has since resolved. Personal history of TIAs and hypertension. EXAM: CT HEAD WITHOUT CONTRAST TECHNIQUE: Contiguous axial images were obtained from the base of the skull through the vertex without intravenous contrast. COMPARISON:  MRI of the brain 02/06/2003 FINDINGS: Moderate diffuse age advanced white matter hypoattenuation is evident  bilaterally. There is scattered hypoattenuation throughout the basal ganglia and thalami bilaterally. T2 changes extend into the corona radiata bilaterally. The insular ribbon is intact. No focal cortical defects are evident. The ventricles are proportionate to the degree of atrophy. No significant extra-axial fluid collection is present. Midline sagittal structures are within normal limits. White matter changes extend into the brainstem. The paranasal sinuses and mastoid air cells are clear. IMPRESSION: 1. Moderate diffuse age advanced white matter hypoattenuation with extension into the brainstem. The patient had significant white matter changes 13 years ago. This represents a significant progression, likely related to chronic microvascular ischemia. MRI without contrast could be used for further characterization. 2. Multiple lacunar infarcts within the basal ganglia bilaterally are age indeterminate. 3. No definite acute intracranial abnormality. Electronically Signed   By: Marin Roberts M.D.   On: 10/12/2015 23:03   [name]  Result Date: 10/13/2015 CLINICAL DATA:  TIA weakness and difficulty walking. EXAM: MRI HEAD WITHOUT CONTRAST MRA HEAD WITHOUT CONTRAST TECHNIQUE: Multiplanar, multiecho pulse sequences of the brain and surrounding structures were obtained without intravenous contrast. Angiographic images of the head were obtained using MRA technique without contrast. COMPARISON:  02/06/2003 FINDINGS: MRI HEAD FINDINGS Calvarium and upper cervical spine: Hypo intense appearance of cervical marrow is stable from 2004. Mild heterogeneity in the clivus is also stable. Question if this is related to patient's reported chronic renal failure. Orbits: Negative. Sinuses and Mastoids: Clear. Brain: Scattered subcentimeter acute white matter infarcts along the left lateral ventricle, from the frontal to occipital horns. Small acute infarct in the periatrial white matter on the right. Small left  pontine acute infarct. Small posterior left frontal and bilateral lateral temporal cortex infarcts. No superimposed acute hemorrhage. No evidence of major vessel occlusion. Small-vessel ischemic changes have developed diffusely since 2004, with numerous lacunes in the brainstem, cerebral white matter and deep grey nuclei. Progressive dilated perivascular spaces as seen with chronic hypertension. Confluent gliosis in the cerebral white matter. Brain volume is also decreased with corpus callosum thinning. There are numerous chronic hemorrhagic foci, both lobar and deep. Overall pattern suggests hypertensive hemorrhages. No hydrocephalus or mass lesion. MRA HEAD FINDINGS Symmetric carotid arteries. Left vertebral artery dominance with right vertebral artery ending in the PICA. Intact circle-of-Willis with sizable posterior communicating arteries. No major branch occlusion or proximal/treatable stenosis. No vessel beading. IMPRESSION: 1. Scattered small acute infarcts in the left more than right  cerebral white matter and cortex. Small acute infarcts in the left pons. Pattern suggests central embolic disease. 2. Severe chronic microvascular disease. 3. Negative intracranial MRA. Electronically Signed   By: Marnee Spring M.D.   On: 10/13/2015 08:52   US Renal  Result Date: 10/13/2015 CLINICAL DATA:  Chronic renal insufficiency with worsening of function over baseline; history of hypertension, previous T IA. EXAM: RENAL / URINARY TRACT ULTRASOUND COMPLETE COMPARISON:  None in PACs FINDINGS: Right Kidney: Length: 11.9 cm. The renal cortical echotexture remains lower than that of the adjacent liver. There is an exophytic midpole cyst laterally measuring 1.2 cm in greatest dimension. There is a medial mid upper pole parapelvic cyst measuring 1.3 cm in greatest dimension. There is no hydronephrosis. No stones are evident. Left Kidney: Length: 10.3 cm. The renal cortical echotexture is similar to that on the right. There  is a mid to lower pole cortical cyst measuring 1.5 cm in diameter. Bladder: The partially distended urinary bladder is unremarkable. IMPRESSION: No evidence of obstruction, cortical atrophy, or increased cortical echotexture. Simple appearing cysts bilaterally. Electronically Signed   By: David  [place] M.D.   On: 10/13/2015 09:01   Mr Maxine Glenn Head/brain ZO Cm  Result Date: 10/13/2015 CLINICAL DATA:  TIA weakness and difficulty walking. EXAM: MRI HEAD WITHOUT CONTRAST MRA HEAD WITHOUT CONTRAST TECHNIQUE: Multiplanar, multiecho pulse sequences of the brain and surrounding structures were obtained without intravenous contrast. Angiographic images of the head were obtained using MRA technique without contrast. COMPARISON:  02/06/2003 FINDINGS: MRI HEAD FINDINGS Calvarium and upper cervical spine: Hypo intense appearance of cervical marrow is stable from 2004. Mild heterogeneity in the clivus is also stable. Question if this is related to patient's reported chronic renal failure. Orbits: Negative. Sinuses and Mastoids: Clear. Brain: Scattered subcentimeter acute white matter infarcts along the left lateral ventricle, from the frontal to occipital horns. Small acute infarct in the periatrial white matter on the right. Small left pontine acute infarct. Small posterior left frontal and bilateral lateral temporal cortex infarcts. No superimposed acute hemorrhage. No evidence of major vessel occlusion. Small-vessel ischemic changes have developed diffusely since 2004, with numerous lacunes in the brainstem, cerebral white matter and deep grey nuclei. Progressive dilated perivascular spaces as seen with chronic hypertension. Confluent gliosis in the cerebral white matter. Brain volume is also decreased with corpus callosum thinning. There are numerous chronic hemorrhagic foci, both lobar and deep. Overall pattern suggests hypertensive hemorrhages. No hydrocephalus or mass lesion. MRA HEAD FINDINGS Symmetric carotid arteries.  Left vertebral artery dominance with right vertebral artery ending in the PICA. Intact circle-of-Willis with sizable posterior communicating arteries. No major branch occlusion or proximal/treatable stenosis. No vessel beading. IMPRESSION: 1. Scattered small acute infarcts in the left more than right cerebral white matter and cortex. Small acute infarcts in the left pons. Pattern suggests central embolic disease. 2. Severe chronic microvascular disease. 3. Negative intracranial MRA. Electronically Signed   By: Marnee Spring M.D.   On: 10/13/2015 08:52     CBC  Recent Labs Lab 10/12/15 1748 10/13/15 1049 10/14/15 0606  WBC 4.1 3.9* 4.1  HGB 13.0 12.0* 11.5*  HCT 37.6* 36.5* 34.5*  PLT 242 228 213  MCV 76.6* 79.2 78.2  MCH 26.5 26.0 26.1  MCHC 34.6 32.9 33.3  RDW 13.1 13.1 12.9    Chemistries   Recent Labs Lab 10/12/15 1748 10/13/15 1049 10/14/15 0606  NA 137 137 136  K 4.2 4.6 4.8  CL 104 104 107  CO2 24 24  21*  GLUCOSE 97 137* 109*  BUN 38* 30* 27*  CREATININE 1.94* 1.77* 1.65*  CALCIUM 9.5 9.6 9.4   ------------------------------------------------------------------------------------------------------------------  Recent Labs  10/13/15 0516  CHOL 157  HDL 31*  LDLCALC 110*  TRIG 82  CHOLHDL 5.1    Lab Results  Component Value Date   HGBA1C 5.8 (H) 10/13/2015   ------------------------------------------------------------------------------------------------------------------ No results for input(s): TSH, T4TOTAL, T3FREE, THYROIDAB in the last 72 hours.  Invalid input(s): FREET3 ------------------------------------------------------------------------------------------------------------------ No results for input(s): VITAMINB12, FOLATE, FERRITIN, TIBC, IRON, RETICCTPCT in the last 72 hours.  Coagulation profile No results for input(s): INR, PROTIME in the last 168 hours.  No results for input(s): DDIMER in the last 72 hours.  Cardiac Enzymes  Recent  Labs Lab 10/13/15 0516  TROPONINI 0.05*   ------------------------------------------------------------------------------------------------------------------ No results found for: BNP   Madailein Londo M.D on 10/15/2015 at 5:59 PM  Between 7am to 7pm - Pager - [phone]  After 7pm go to [email or web address] - password [email or web address]  [facility] -  Office  [phone]  Dragon dictation system was used to create this note, attempts have been made to correct errors, however presence of uncorrected errors is not a reflection quality of care provided

## 2015-10-16 NOTE — Progress Notes (Signed)
*  PRELIMINARY RESULTS* Echocardiogram Echocardiogram Transesophageal has been performed.  Jeryl Columbialliott, Dio Giller 10/16/2015, 1:00 PM

## 2015-10-16 NOTE — Discharge Summary (Signed)
Vincent Hogan, is a 50 y.o. male  DOB 10/09/1965  MRN [id number].  Admission date:  10/12/2015  Admitting Physician  Therisa DoyneAnastassia Doutova, MD  Discharge Date:  10/16/2015   Primary MD  Willow OraJose Paz, MD  Recommendations for primary care physician for things to follow:     Admission Diagnosis  TIA (transient ischemic attack) [G45.9] Cerebral infarction due to unspecified mechanism [I63.9]   Discharge Diagnosis  TIA (transient ischemic attack) [G45.9] Cerebral infarction due to unspecified mechanism [I63.9]   Active Problems:   Benign essential HTN   TIA (transient ischemic attack)   CVA (cerebral infarction)   Chronic kidney disease   Acute on chronic renal failure (HCC)   HLD (hyperlipidemia)   Cerebral infarction due to unspecified mechanism   Hx-TIA (transient ischemic attack)   Left knee injury   Acute blood loss anemia      Past Medical History:  Diagnosis Date  . Hypertension   . TIA (transient ischemic attack)     History reviewed. No pertinent surgical history.     HPI  from the history and physical done on the day of admission:   Chief Complaint: Left sided weakness  HPI: Vincent Hogan is a 50 y.o. male with medical history significant of TIAs in the past on poorly controlled hypertension  CKD  Presented with one-sided weakness 3 days ago that seemed to have resolved but over the weekend patient was trying to move his daughter into college and states he couldn't have helped as much as he wanted to his family was concerned for him having a stroke because he was dragging his left leg at the hold onto the wall and use a cane currently symptoms have resolved. He denies any nausea vomiting no shortness of breath no chest pain he may have had a mild facial droop. Denies any slurred speech he hasn't been taking his medications as prescribed his blood pressure has been running high.  He remembers having history of TIAs but unsure if she ever had a stroke. He's been on aspirin 81 mg a day for history of TIAs  Patient has history of hypertension but has run out of his medications he takes Diovan, lisinopril, and HCTZ. He reports since he hasn't taken his blood pressure medication he have had some peripheral edema Reports no trouble with memory.   IN ER: Afebrile heart rate 85 blood pressure 155/103 on arrival Sodium 137 BUN 38 potassium 4.2 cranial 1.94 which is up from baseline of 1.5   2 years ago WBC 4.1 hemoglobin 13 CT head showed moderate diffuse age advanced white matter hypoattenuation with extension to brainstem chronic microvascular ischemia multiple lacunar infarcts within the basal ganglia bilaterally undetermined age  ER provided discussed case with neurology on-call who [facility] will [facility] up      Hospital Course:      1)Acute CVA-multiple infarcts, MRI/MRA of brain/Head  shows   Scattered small acute infarcts in the left more than right cerebral white matter  and cortex. Small acute infarcts in the left pons. Pattern suggests central embolic disease.  Severe chronic microvascular disease. . Negative intracranial MRA. discussed with neurologist, continue aspirin and Lipitor, TEEon Friday, 10/16/2015 Without acute findings, EF over 60%, patient continues to have some cognitive deficits, no new motor deficits, excuse from work until after 11/02/2015. Cardiologist advised event monitor or loop recorder due to recurrent strokes without definite source  2)HTN-stable   Discharge Condition: Improved  Follow UP  Follow-up Information    [facility] .   Specialty:  Rehabilitation Why:  For PT/OT/ST. They will contact you for the first appointment.  Contact information: [address] [id number] mc [place] [address] [phone]       Willow Ora, MD.  Schedule an appointment as soon as possible for a visit in 1 week(s).   Specialty:  Internal Medicine Contact information: [address] [place] [address] [phone]            Consults obtained - Neurology/cardiology  Diet and Activity recommendation:  As advised  Discharge Instructions    Discharge Instructions    Ambulatory referral to Occupational Therapy    Complete by:  As directed   Ambulatory referral to Physical Medicine Rehab    Complete by:  As directed   4 weeks post discharge   Ambulatory referral to Physical Therapy    Complete by:  As directed   Ambulatory referral to Speech Therapy    Complete by:  As directed   Call MD for:  difficulty breathing, headache or visual disturbances    Complete by:  As directed   Call MD for:  persistant dizziness or light-headedness    Complete by:  As directed   Diet - low sodium heart healthy    Complete by:  As directed   Discharge instructions    Complete by:  As directed   1)Take medications as prescribed 2)Low-salt low-cholesterol diet advised 3)Follow-up with cardiologist to discuss possible Loop Recorder implantation Versus event monitor placement 4)Excuse from work from 10/13/15 thru 11/02/15 inclusive.   Increase activity slowly    Complete by:  As directed   Outpatient physical therapy/rehabilitation        Discharge Medications       Medication List    STOP taking these medications   aspirin EC 81 MG tablet Replaced by:  aspirin 325 MG tablet     TAKE these medications   aspirin 325 MG tablet Take 1 tablet (325 mg total) by mouth daily. With Food Replaces:  aspirin EC 81 MG tablet   atorvastatin 80 MG tablet Commonly known as:  LIPITOR Take 1 tablet (80 mg total) by mouth daily at 6 PM.   hydrochlorothiazide 25 MG tablet Commonly known as:  HYDRODIURIL Take 25 mg by mouth daily.   lisinopril 20 MG tablet Commonly known as:  PRINIVIL,ZESTRIL Take 20 mg by mouth daily.     verapamil 40 MG tablet Commonly known as:  CALAN Take 40 mg by mouth daily.      Major procedures and Radiology Reports - PLEASE review detailed and final reports for all details, in brief -   Dg Chest 2 View  Result Date: 10/13/2015 CLINICAL DATA:  Admission chest radiograph. Transient ischemic attack. Initial encounter. EXAM: CHEST  2 VIEW COMPARISON:  Chest radiograph performed 07/13/2005 FINDINGS: The lungs are well-aerated and clear. There is no evidence of focal opacification, pleural effusion or pneumothorax. The heart is borderline  normal in size. No acute osseous abnormalities are seen. IMPRESSION: No acute cardiopulmonary process seen. Electronically Signed   By: Roanna Raider M.D.   On: 10/13/2015 01:22   Ct Head Wo Contrast  Result Date: 10/12/2015 CLINICAL DATA:  Weakness over the weekend which has since resolved. Personal history of TIAs and hypertension. EXAM: CT HEAD WITHOUT CONTRAST TECHNIQUE: Contiguous axial images were obtained from the base of the skull through the vertex without intravenous contrast. COMPARISON:  MRI of the brain 02/06/2003 FINDINGS: Moderate diffuse age advanced white matter hypoattenuation is evident bilaterally. There is scattered hypoattenuation throughout the basal ganglia and thalami bilaterally. T2 changes extend into the corona radiata bilaterally. The insular ribbon is intact. No focal cortical defects are evident. The ventricles are proportionate to the degree of atrophy. No significant extra-axial fluid collection is present. Midline sagittal structures are within normal limits. White matter changes extend into the brainstem. The paranasal sinuses and mastoid air cells are clear. IMPRESSION: 1. Moderate diffuse age advanced white matter hypoattenuation with extension into the brainstem. The patient had significant white matter changes 13 years ago. This represents a significant progression, likely related to chronic microvascular ischemia. MRI  without contrast could be used for further characterization. 2. Multiple lacunar infarcts within the basal ganglia bilaterally are age indeterminate. 3. No definite acute intracranial abnormality. Electronically Signed   By: Marin Roberts M.D.   On: 10/12/2015 23:03   [name]  Result Date: 10/13/2015 CLINICAL DATA:  TIA weakness and difficulty walking. EXAM: MRI HEAD WITHOUT CONTRAST MRA HEAD WITHOUT CONTRAST TECHNIQUE: Multiplanar, multiecho pulse sequences of the brain and surrounding structures were obtained without intravenous contrast. Angiographic images of the head were obtained using MRA technique without contrast. COMPARISON:  02/06/2003 FINDINGS: MRI HEAD FINDINGS Calvarium and upper cervical spine: Hypo intense appearance of cervical marrow is stable from 2004. Mild heterogeneity in the clivus is also stable. Question if this is related to patient's reported chronic renal failure. Orbits: Negative. Sinuses and Mastoids: Clear. Brain: Scattered subcentimeter acute white matter infarcts along the left lateral ventricle, from the frontal to occipital horns. Small acute infarct in the periatrial white matter on the right. Small left pontine acute infarct. Small posterior left frontal and bilateral lateral temporal cortex infarcts. No superimposed acute hemorrhage. No evidence of major vessel occlusion. Small-vessel ischemic changes have developed diffusely since 2004, with numerous lacunes in the brainstem, cerebral white matter and deep grey nuclei. Progressive dilated perivascular spaces as seen with chronic hypertension. Confluent gliosis in the cerebral white matter. Brain volume is also decreased with corpus callosum thinning. There are numerous chronic hemorrhagic foci, both lobar and deep. Overall pattern suggests hypertensive hemorrhages. No hydrocephalus or mass lesion. MRA HEAD FINDINGS Symmetric carotid arteries. Left vertebral artery dominance with right vertebral artery  ending in the PICA. Intact circle-of-Willis with sizable posterior communicating arteries. No major branch occlusion or proximal/treatable stenosis. No vessel beading. IMPRESSION: 1. Scattered small acute infarcts in the left more than right cerebral white matter and cortex. Small acute infarcts in the left pons. Pattern suggests central embolic disease. 2. Severe chronic microvascular disease. 3. Negative intracranial MRA. Electronically Signed   By: Marnee Spring M.D.   On: 10/13/2015 08:52   US Renal  Result Date: 10/13/2015 CLINICAL DATA:  Chronic renal insufficiency with worsening of function over baseline; history of hypertension, previous T IA. EXAM: RENAL / URINARY TRACT ULTRASOUND COMPLETE COMPARISON:  None in PACs FINDINGS: Right Kidney: Length: 11.9 cm. The renal cortical  echotexture remains lower than that of the adjacent liver. There is an exophytic midpole cyst laterally measuring 1.2 cm in greatest dimension. There is a medial mid upper pole parapelvic cyst measuring 1.3 cm in greatest dimension. There is no hydronephrosis. No stones are evident. Left Kidney: Length: 10.3 cm. The renal cortical echotexture is similar to that on the right. There is a mid to lower pole cortical cyst measuring 1.5 cm in diameter. Bladder: The partially distended urinary bladder is unremarkable. IMPRESSION: No evidence of obstruction, cortical atrophy, or increased cortical echotexture. Simple appearing cysts bilaterally. Electronically Signed   By: David  SwazilandJordan M.D.   On: 10/13/2015 09:01   Mr Maxine GlennMra Head/brain BMWo Cm  Result Date: 10/13/2015 CLINICAL DATA:  TIA weakness and difficulty walking. EXAM: MRI HEAD WITHOUT CONTRAST MRA HEAD WITHOUT CONTRAST TECHNIQUE: Multiplanar, multiecho pulse sequences of the brain and surrounding structures were obtained without intravenous contrast. Angiographic images of the head were obtained using MRA technique without contrast. COMPARISON:  02/06/2003 FINDINGS: MRI HEAD  FINDINGS Calvarium and upper cervical spine: Hypo intense appearance of cervical marrow is stable from 2004. Mild heterogeneity in the clivus is also stable. Question if this is related to patient's reported chronic renal failure. Orbits: Negative. Sinuses and Mastoids: Clear. Brain: Scattered subcentimeter acute white matter infarcts along the left lateral ventricle, from the frontal to occipital horns. Small acute infarct in the periatrial white matter on the right. Small left pontine acute infarct. Small posterior left frontal and bilateral lateral temporal cortex infarcts. No superimposed acute hemorrhage. No evidence of major vessel occlusion. Small-vessel ischemic changes have developed diffusely since 2004, with numerous lacunes in the brainstem, cerebral white matter and deep grey nuclei. Progressive dilated perivascular spaces as seen with chronic hypertension. Confluent gliosis in the cerebral white matter. Brain volume is also decreased with corpus callosum thinning. There are numerous chronic hemorrhagic foci, both lobar and deep. Overall pattern suggests hypertensive hemorrhages. No hydrocephalus or mass lesion. MRA HEAD FINDINGS Symmetric carotid arteries. Left vertebral artery dominance with right vertebral artery ending in the PICA. Intact circle-of-Willis with sizable posterior communicating arteries. No major branch occlusion or proximal/treatable stenosis. No vessel beading. IMPRESSION: 1. Scattered small acute infarcts in the left more than right cerebral white matter and cortex. Small acute infarcts in the left pons. Pattern suggests central embolic disease. 2. Severe chronic microvascular disease. 3. Negative intracranial MRA. Electronically Signed   By: Marnee SpringJonathon  Watts M.D.   On: 10/13/2015 08:52   Micro Results   No results found for this or any previous visit (from the past 240 hour(s)).     Today   Subjective    Vincent Hogan today has no New complaints, eager to go home  after a negative TEE today          Patient has been seen and examined prior to discharge   Objective   Blood pressure (!) 137/103, pulse 86, temperature 98.2 F (36.8 C), temperature source Oral, resp. rate 18, height 6\' 5"  (1.956 m), weight 107.3 kg (236 lb 8 oz), SpO2 100 %.   Intake/Output Summary (Last 24 hours) at 10/16/15 1539 Last data filed at 10/15/15 1910  Gross per 24 hour  Intake              240 ml  Output              500 ml  Net             -260 ml  Exam Gen:- Awake  In no apparent distress  HEENT:- [place].AT,   Neck-Supple Neck,No JVD,  Lungs- mostly clear  CV- S1, S2 normal Abd-  +ve B.Sounds, Abd Soft, No tenderness,    Extremity/Skin:- Intact peripheral pulses   Neuro-slightly unsteady gait with cognitive deficits,   Data Review   CBC w Diff: Lab Results  Component Value Date   WBC 4.1 10/14/2015   HGB 11.5 (L) 10/14/2015   HCT 34.5 (L) 10/14/2015   PLT 213 10/14/2015   LYMPHOPCT 29.1 12/08/2006   MONOPCT 9.0 12/08/2006   EOSPCT 1.8 12/08/2006   BASOPCT 0.6 12/08/2006    CMP: Lab Results  Component Value Date   NA 136 10/14/2015   K 4.8 10/14/2015   CL 107 10/14/2015   CO2 21 (L) 10/14/2015   BUN 27 (H) 10/14/2015   CREATININE 1.65 (H) 10/14/2015   AST 39 (H) 09/12/2008   ALT 79 (H) 09/12/2008  .   Total Discharge time is about 33 minutes  Kavian Peters M.D on 10/16/2015 at 3:39 PM  [facility]   Office  [phone]  Dragon dictation system was used to create this note, attempts have been made to correct errors, however presence of uncorrected errors is not a reflection quality of care provided

## 2015-10-16 NOTE — Interval H&P Note (Signed)
History and Physical Interval Note:  10/16/2015 11:30 AM  Earlie RavelingWesley D Fannin  has presented today for surgery, with the diagnosis of stroke  The various methods of treatment have been discussed with the patient and family. After consideration of risks, benefits and other options for treatment, the patient has consented to  Procedure(s) with comments: TRANSESOPHAGEAL ECHOCARDIOGRAM (TEE) (N/A) - Patient is also getting a loop as a surgical intervention .  The patient's history has been reviewed, patient examined, no change in status, stable for surgery.  I have reviewed the patient's chart and labs.  Questions were answered to the patient's satisfaction.     [employer]Mark Greco Gastelum

## 2015-10-16 NOTE — Progress Notes (Signed)
Spoke w pt's brother Molly Madurorobert. Pt going home w him here in [place]. 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 Enc him to start w school system to see if they have short and long term disabilty with state plan. Explained if disabilty to last 12mos can apply to soc sec disability also. Pt has been referred to neuro rehab for outpt pt and ot. Neuro rehab will call pt/brother to set up appt. Brother Molly Madurorobert has phone number also to call and ck on appt.

## 2015-10-16 NOTE — Progress Notes (Signed)
Rehab admissions - Patient making good progress and feel he can be managed with outpatient therapies.  I will sign off for inpatient rehab admissions at this time.  #[phone]

## 2015-10-16 NOTE — Progress Notes (Signed)
Discharge orders received, pt for discharge home today,  IV D/C.  D/C instructions and Rx given with verbalized understanding.  Family at bedside to assist pt with discharge. Staff brought pt downstairs via wheelchair.  

## 2015-10-18 ENCOUNTER — Encounter (HOSPITAL_COMMUNITY): Payer: Self-pay | Admitting: Cardiology

## 2016-11-29 ENCOUNTER — Encounter (HOSPITAL_COMMUNITY): Payer: Self-pay

## 2016-11-29 ENCOUNTER — Emergency Department (HOSPITAL_COMMUNITY): Payer: BC Managed Care – PPO

## 2016-11-29 ENCOUNTER — Observation Stay (HOSPITAL_COMMUNITY): Payer: BC Managed Care – PPO

## 2016-11-29 ENCOUNTER — Observation Stay (HOSPITAL_COMMUNITY)
Admission: EM | Admit: 2016-11-29 | Discharge: 2016-12-01 | Disposition: A | Discharge status: Home / Self Care | Payer: BC Managed Care – PPO | Attending: Internal Medicine | Admitting: Internal Medicine

## 2016-11-29 DIAGNOSIS — Z6831 Body mass index (BMI) 31.0-31.9, adult: Secondary | ICD-10-CM | POA: Insufficient documentation

## 2016-11-29 DIAGNOSIS — R4701 Aphasia: Secondary | ICD-10-CM

## 2016-11-29 DIAGNOSIS — F0150 Vascular dementia without behavioral disturbance: Secondary | ICD-10-CM | POA: Insufficient documentation

## 2016-11-29 DIAGNOSIS — I639 Cerebral infarction, unspecified: Secondary | ICD-10-CM | POA: Diagnosis present

## 2016-11-29 DIAGNOSIS — E669 Obesity, unspecified: Secondary | ICD-10-CM | POA: Diagnosis not present

## 2016-11-29 DIAGNOSIS — I672 Cerebral atherosclerosis: Secondary | ICD-10-CM | POA: Insufficient documentation

## 2016-11-29 DIAGNOSIS — G92 Toxic encephalopathy: Secondary | ICD-10-CM | POA: Insufficient documentation

## 2016-11-29 DIAGNOSIS — Z7902 Long term (current) use of antithrombotics/antiplatelets: Secondary | ICD-10-CM | POA: Insufficient documentation

## 2016-11-29 DIAGNOSIS — I129 Hypertensive chronic kidney disease with stage 1 through stage 4 chronic kidney disease, or unspecified chronic kidney disease: Secondary | ICD-10-CM | POA: Insufficient documentation

## 2016-11-29 DIAGNOSIS — N189 Chronic kidney disease, unspecified: Secondary | ICD-10-CM | POA: Diagnosis present

## 2016-11-29 DIAGNOSIS — R059 Cough, unspecified: Secondary | ICD-10-CM

## 2016-11-29 DIAGNOSIS — Z79899 Other long term (current) drug therapy: Secondary | ICD-10-CM | POA: Insufficient documentation

## 2016-11-29 DIAGNOSIS — N183 Chronic kidney disease, stage 3 (moderate): Secondary | ICD-10-CM | POA: Insufficient documentation

## 2016-11-29 DIAGNOSIS — Z8249 Family history of ischemic heart disease and other diseases of the circulatory system: Secondary | ICD-10-CM | POA: Insufficient documentation

## 2016-11-29 DIAGNOSIS — Z7982 Long term (current) use of aspirin: Secondary | ICD-10-CM | POA: Insufficient documentation

## 2016-11-29 DIAGNOSIS — Z9114 Patient's other noncompliance with medication regimen: Secondary | ICD-10-CM | POA: Insufficient documentation

## 2016-11-29 DIAGNOSIS — R05 Cough: Secondary | ICD-10-CM

## 2016-11-29 DIAGNOSIS — I7389 Other specified peripheral vascular diseases: Secondary | ICD-10-CM | POA: Insufficient documentation

## 2016-11-29 DIAGNOSIS — N179 Acute kidney failure, unspecified: Secondary | ICD-10-CM | POA: Diagnosis not present

## 2016-11-29 DIAGNOSIS — E785 Hyperlipidemia, unspecified: Secondary | ICD-10-CM | POA: Insufficient documentation

## 2016-11-29 DIAGNOSIS — R479 Unspecified speech disturbances: Secondary | ICD-10-CM

## 2016-11-29 DIAGNOSIS — R4182 Altered mental status, unspecified: Secondary | ICD-10-CM | POA: Diagnosis not present

## 2016-11-29 DIAGNOSIS — G9340 Encephalopathy, unspecified: Secondary | ICD-10-CM | POA: Diagnosis not present

## 2016-11-29 DIAGNOSIS — R4781 Slurred speech: Secondary | ICD-10-CM | POA: Diagnosis not present

## 2016-11-29 DIAGNOSIS — M109 Gout, unspecified: Secondary | ICD-10-CM | POA: Insufficient documentation

## 2016-11-29 DIAGNOSIS — Z8673 Personal history of transient ischemic attack (TIA), and cerebral infarction without residual deficits: Secondary | ICD-10-CM | POA: Diagnosis not present

## 2016-11-29 DIAGNOSIS — Z8679 Personal history of other diseases of the circulatory system: Secondary | ICD-10-CM

## 2016-11-29 DIAGNOSIS — Z823 Family history of stroke: Secondary | ICD-10-CM | POA: Insufficient documentation

## 2016-11-29 LAB — RAPID URINE DRUG SCREEN, HOSP PERFORMED
Amphetamines: NOT DETECTED
Barbiturates: NOT DETECTED
Benzodiazepines: NOT DETECTED
Cocaine: NOT DETECTED
Opiates: NOT DETECTED
Tetrahydrocannabinol: NOT DETECTED

## 2016-11-29 LAB — CREATININE, SERUM
Creatinine, Ser: 1.82 mg/dL — ABNORMAL HIGH (ref 0.61–1.24)
GFR calc Af Amer: 48 mL/min — ABNORMAL LOW (ref >60)
GFR calc non Af Amer: 41 mL/min — ABNORMAL LOW (ref >60)

## 2016-11-29 LAB — URINALYSIS, ROUTINE W REFLEX MICROSCOPIC
Bacteria, UA: NONE SEEN
Bilirubin Urine: NEGATIVE
Glucose, UA: NEGATIVE mg/dL
Hgb urine dipstick: NEGATIVE
Ketones, ur: NEGATIVE mg/dL
Leukocytes, UA: NEGATIVE
Nitrite: NEGATIVE
Protein, ur: 30 mg/dL — AB
Specific Gravity, Urine: 1.023 (ref 1.005–1.030)
pH: 5 (ref 5.0–8.0)

## 2016-11-29 LAB — I-STAT CHEM 8, ED
BUN: 26 mg/dL — ABNORMAL HIGH (ref 6–20)
Calcium, Ion: 1.21 mmol/L (ref 1.15–1.40)
Chloride: 104 mmol/L (ref 101–111)
Creatinine, Ser: 2 mg/dL — ABNORMAL HIGH (ref 0.61–1.24)
Glucose, Bld: 142 mg/dL — ABNORMAL HIGH (ref 65–99)
HCT: 43 % (ref 39.0–52.0)
Hemoglobin: 14.6 g/dL (ref 13.0–17.0)
Potassium: 5.1 mmol/L (ref 3.5–5.1)
Sodium: 138 mmol/L (ref 135–145)
TCO2: 24 mmol/L (ref 22–32)

## 2016-11-29 LAB — COMPREHENSIVE METABOLIC PANEL
ALT: 28 U/L (ref 17–63)
AST: 31 U/L (ref 15–41)
Albumin: 3.9 g/dL (ref 3.5–5.0)
Alkaline Phosphatase: 97 U/L (ref 38–126)
Anion gap: 9 (ref 5–15)
BUN: 21 mg/dL — ABNORMAL HIGH (ref 6–20)
CO2: 24 mmol/L (ref 22–32)
Calcium: 9.6 mg/dL (ref 8.9–10.3)
Chloride: 102 mmol/L (ref 101–111)
Creatinine, Ser: 2 mg/dL — ABNORMAL HIGH (ref 0.61–1.24)
GFR calc Af Amer: 43 mL/min — ABNORMAL LOW (ref >60)
GFR calc non Af Amer: 37 mL/min — ABNORMAL LOW (ref >60)
Glucose, Bld: 143 mg/dL — ABNORMAL HIGH (ref 65–99)
Potassium: 5.1 mmol/L (ref 3.5–5.1)
Sodium: 135 mmol/L (ref 135–145)
Total Bilirubin: 0.9 mg/dL (ref 0.3–1.2)
Total Protein: 8.8 g/dL — ABNORMAL HIGH (ref 6.5–8.1)

## 2016-11-29 LAB — CBC
HCT: 39.5 % (ref 39.0–52.0)
HEMATOCRIT: 36.7 % — AB (ref 39.0–52.0)
Hemoglobin: 12.6 g/dL — ABNORMAL LOW (ref 13.0–17.0)
Hemoglobin: 12.2 g/dL — ABNORMAL LOW (ref 13.0–17.0)
MCH: 24.6 pg — ABNORMAL LOW (ref 26.0–34.0)
MCH: 25.7 pg — ABNORMAL LOW (ref 26.0–34.0)
MCHC: 31.9 g/dL (ref 30.0–36.0)
MCHC: 33.2 g/dL (ref 30.0–36.0)
MCV: 77.1 fL — ABNORMAL LOW (ref 78.0–100.0)
MCV: 77.3 fL — AB (ref 78.0–100.0)
Platelets: 220 10*3/uL (ref 150–400)
Platelets: 214 10*3/uL (ref 150–400)
RBC: 5.12 MIL/uL (ref 4.22–5.81)
RBC: 4.75 MIL/uL (ref 4.22–5.81)
RDW: 14 % (ref 11.5–15.5)
RDW: 13.7 % (ref 11.5–15.5)
WBC: 5.8 10*3/uL (ref 4.0–10.5)
WBC: 5.5 10*3/uL (ref 4.0–10.5)

## 2016-11-29 LAB — PROTIME-INR
INR: 1.09
Prothrombin Time: 14 seconds (ref 11.4–15.2)

## 2016-11-29 LAB — DIFFERENTIAL
Basophils Absolute: 0 10*3/uL (ref 0.0–0.1)
Basophils Relative: 0 %
Eosinophils Absolute: 0.1 10*3/uL (ref 0.0–0.7)
Eosinophils Relative: 1 %
Lymphocytes Relative: 16 %
Lymphs Abs: 0.9 10*3/uL (ref 0.7–4.0)
Monocytes Absolute: 0.2 10*3/uL (ref 0.1–1.0)
Monocytes Relative: 4 %
Neutro Abs: 4.6 10*3/uL (ref 1.7–7.7)
Neutrophils Relative %: 79 %

## 2016-11-29 LAB — I-STAT TROPONIN, ED: Troponin i, poc: 0.03 ng/mL (ref 0.00–0.08)

## 2016-11-29 LAB — APTT: aPTT: 30 seconds (ref 24–36)

## 2016-11-29 LAB — ETHANOL: Alcohol, Ethyl (B): <10 mg/dL (ref <10)

## 2016-11-29 MED ORDER — SODIUM CHLORIDE 0.9 % IV BOLUS (SEPSIS)
500.0000 mL | Freq: Once | INTRAVENOUS | Status: AC
  Administered 2016-11-29: 500 mL via INTRAVENOUS

## 2016-11-29 MED ORDER — CHLORTHALIDONE 25 MG PO TABS
25.0000 mg | ORAL_TABLET | Freq: Every day | ORAL | Status: DC
  Administered 2016-11-29 – 2016-12-01 (×3): 25 mg via ORAL
  Filled 2016-11-29 (×3): qty 1

## 2016-11-29 MED ORDER — HYDRALAZINE HCL 20 MG/ML IJ SOLN: 5.0000 mg | Freq: Four times a day (QID) | INTRAMUSCULAR | Status: DC | PRN

## 2016-11-29 MED ORDER — ENOXAPARIN SODIUM 30 MG/0.3ML ~~LOC~~ SOLN
30.0000 mg | SUBCUTANEOUS | Status: DC
  Administered 2016-11-29: 30 mg via SUBCUTANEOUS
  Filled 2016-11-29: qty 0.3

## 2016-11-29 MED ORDER — IOPAMIDOL (ISOVUE-370) INJECTION 76%
INTRAVENOUS | Status: AC
  Filled 2016-11-29: qty 50

## 2016-11-29 MED ORDER — DONEPEZIL HCL 5 MG PO TABS
5.0000 mg | ORAL_TABLET | Freq: Every day | ORAL | Status: DC
  Administered 2016-11-29 – 2016-11-30 (×2): 5 mg via ORAL
  Filled 2016-11-29 (×2): qty 1

## 2016-11-29 MED ORDER — SODIUM CHLORIDE 0.9 % IV BOLUS (SEPSIS)
500.0000 mL | Freq: Once | INTRAVENOUS | Status: AC
Start: 2016-11-29 — End: 2016-11-29
  Administered 2016-11-29: 500 mL via INTRAVENOUS

## 2016-11-29 MED ORDER — LABETALOL HCL 200 MG PO TABS
200.0000 mg | ORAL_TABLET | Freq: Two times a day (BID) | ORAL | Status: DC
  Administered 2016-11-30 – 2016-12-01 (×3): 200 mg via ORAL
  Filled 2016-11-29 (×3): qty 1

## 2016-11-29 MED ORDER — ATORVASTATIN CALCIUM 80 MG PO TABS
80.0000 mg | ORAL_TABLET | Freq: Every day | ORAL | Status: DC
  Administered 2016-11-30: 80 mg via ORAL
  Filled 2016-11-29: qty 1

## 2016-11-29 MED ORDER — CLOPIDOGREL BISULFATE 75 MG PO TABS
75.0000 mg | ORAL_TABLET | Freq: Every day | ORAL | Status: DC
  Administered 2016-11-29 – 2016-11-30 (×2): 75 mg via ORAL
  Filled 2016-11-29 (×2): qty 1

## 2016-11-29 MED ORDER — SODIUM CHLORIDE 0.9 % IV BOLUS (SEPSIS): 1000.0000 mL | Freq: Once | INTRAVENOUS | Status: DC

## 2016-11-29 MED ORDER — ASPIRIN 325 MG PO TABS
325.0000 mg | ORAL_TABLET | Freq: Every day | ORAL | Status: DC
  Administered 2016-11-30 – 2016-12-01 (×2): 325 mg via ORAL
  Filled 2016-11-29 (×2): qty 1

## 2016-11-29 MED ORDER — LABETALOL HCL 200 MG PO TABS: 200.0000 mg | ORAL_TABLET | Freq: Two times a day (BID) | ORAL | Status: DC

## 2016-11-29 MED ORDER — LABETALOL HCL 200 MG PO TABS
200.0000 mg | ORAL_TABLET | Freq: Once | ORAL | Status: AC
  Administered 2016-11-29: 200 mg via ORAL
  Filled 2016-11-29: qty 1

## 2016-11-29 MED ORDER — LISINOPRIL 20 MG PO TABS
20.0000 mg | ORAL_TABLET | Freq: Once | ORAL | Status: AC
  Administered 2016-11-29: 20 mg via ORAL
  Filled 2016-11-29: qty 1

## 2016-11-29 MED ORDER — DONEPEZIL HCL 10 MG PO TABS: 10.0000 mg | ORAL_TABLET | Freq: Every day | ORAL | Status: DC

## 2016-11-29 NOTE — ED Provider Notes (Signed)
[facility]-EMERGENCY DEPT Provider Note   CSN: [id number] Arrival date & time: 11/29/16  1350     History   Chief Complaint Chief Complaint  Patient presents with  . Altered Mental Status    HPI Vincent Hogan is a 51 y.o. male with history of hypertension and recent stroke 2 months ago who presents with altered mental status. Reportedly, patient was at [facility] and ordered his food without problem. Soon after, staff noticed that the patient became altered, diaphoretic. They called ambulance. Patient was hypertensive at that time. Since then, patient with aphasia, very slow to respond verbally or to commands. Patient denying any pain. He denies any numbness or tingling.  HPI  Past Medical History:  Diagnosis Date  . Hypertension   . TIA (transient ischemic attack)     Patient Active Problem List   Diagnosis Date Noted  . Acute encephalopathy 11/29/2016  . Hx-TIA (transient ischemic attack)   . Left knee injury   . Acute blood loss anemia   . TIA (transient ischemic attack) 10/13/2015  . CVA (cerebral infarction) 10/13/2015  . Chronic kidney disease 10/13/2015  . Acute on chronic renal failure (HCC) 10/13/2015  . HLD (hyperlipidemia)   . Cerebral infarction due to unspecified mechanism   . GOUT 10/02/2008  . BACK PAIN 06/25/2007  . ERECTILE DYSFUNCTION 12/08/2006  . OSTEOARTHRITIS 12/08/2006  . TRANSIENT ISCHEMIC ATTACKS, HX OF 12/08/2006  . Benign essential HTN 06/09/2006    Past Surgical History:  Procedure Laterality Date  . TEE WITHOUT CARDIOVERSION N/A 10/16/2015   Procedure: TRANSESOPHAGEAL ECHOCARDIOGRAM (TEE);  Surgeon: Jake Bathe, MD;  Location: [facility] ENDOSCOPY;  Service: Cardiovascular;  Laterality: N/A;  Patient is also getting a loop       Home Medications    Prior to Admission medications   Medication Sig Start Date End Date Taking? Authorizing Provider  aspirin 325 MG tablet Take 1 tablet (325 mg total) by mouth daily. With Food 10/16/15  Yes  Shon Hale, MD  atorvastatin (LIPITOR) 80 MG tablet Take 1 tablet (80 mg total) by mouth daily at 6 PM. 10/16/15   Emokpae, Courage, MD  chlorthalidone (HYGROTON) 25 MG tablet Take 25 mg by mouth daily.    [provider]  clopidogrel (PLAVIX) 75 MG tablet Take 75 mg by mouth daily.    [provider]  donepezil (ARICEPT) 5 MG tablet Take 5 mg by mouth See admin instructions.  once daily x14 days, then  daily - picked up 9/24    [provider]  labetalol (NORMODYNE) 200 MG tablet Take 200 mg by mouth 2 (two) times daily.    [provider]  lisinopril (PRINIVIL,ZESTRIL) 20 MG tablet Take 20 mg by mouth daily.    [provider]    Family History Family History  Problem Relation Age of Onset  . Dementia Mother   . Stroke Father   . Hypertension Father   . Hypertension Sister   . Hypertension Brother   . Stroke Other   . CAD Neg Hx   . Cancer Neg Hx     Social History Social History  Substance Use Topics  . Smoking status: Never Smoker  . Smokeless tobacco: Never Used  . Alcohol use No     Allergies   Lisinopril   Review of Systems Review of Systems  Unable to perform ROS: Mental status change     Physical Exam Updated Vital Signs BP (!) 182/113   Pulse (!) 58  Temp 97.7 F (36.5 C)   Resp 18   Ht  (1.93 m)   Wt 108.9 kg (240 lb)   SpO2 99%   BMI 29.21 kg/m   Physical Exam  Constitutional: He appears well-developed and well-nourished. No distress.  HENT:  Head: Normocephalic and atraumatic.  Mouth/Throat: Oropharynx is clear and moist. No oropharyngeal exudate.  Eyes: Pupils are equal, round, and reactive to light. Conjunctivae are normal. Right eye exhibits no discharge. Left eye exhibits no discharge. No scleral icterus.  Neck: Normal range of motion. Neck supple. No thyromegaly present.  Cardiovascular: Normal rate, regular rhythm, normal heart sounds and intact distal pulses.  Exam reveals  no gallop and no friction rub.   No murmur heard. Pulmonary/Chest: Effort normal and breath sounds normal. No stridor. No respiratory distress. He has no wheezes. He has no rales.  Abdominal: Soft. Bowel sounds are normal. He exhibits no distension. There is no tenderness. There is no rebound and no guarding.  Musculoskeletal: He exhibits no edema.  Lymphadenopathy:    He has no cervical adenopathy.  Neurological: He is alert. Coordination normal.  Sensation intact, 5/5 strength to all 4 extremities, grip strength intact; patient only saying one word at a time, very delayed response, if any; very slow to respond to commands  Skin: Skin is warm and dry. No rash noted. He is not diaphoretic. No pallor.  Psychiatric: He has a normal mood and affect.  Nursing note and vitals reviewed.    ED Treatments / Results  Labs (all labs ordered are listed, but only abnormal results are displayed) Labs Reviewed  CBC - Abnormal; Notable for the following:       Result Value   Hemoglobin 12.6 (*)    MCV 77.1 (*)    MCH 24.6 (*)    All other components within normal limits  COMPREHENSIVE METABOLIC PANEL - Abnormal; Notable for the following:    Glucose, Bld 143 (*)    BUN 21 (*)    Creatinine, Ser 2.00 (*)    Total Protein 8.8 (*)    GFR calc non Af Amer 37 (*)    GFR calc Af Amer 43 (*)    All other components within normal limits  URINALYSIS, ROUTINE W REFLEX MICROSCOPIC - Abnormal; Notable for the following:    Protein, ur 30 (*)    Squamous Epithelial / LPF 0-5 (*)    All other components within normal limits  I-STAT CHEM 8, ED - Abnormal; Notable for the following:    BUN 26 (*)    Creatinine, Ser 2.00 (*)    Glucose, Bld 142 (*)    All other components within normal limits  ETHANOL  PROTIME-INR  APTT  DIFFERENTIAL  RAPID URINE DRUG SCREEN, HOSP PERFORMED  I-STAT TROPONIN, ED    EKG  EKG Interpretation  Date/Time:  Tuesday November 29 2016 13:54:37 EDT Ventricular Rate:   63 PR Interval:    QRS Duration: 115 QT Interval:  461 QTC Calculation: 472 R Axis:   48 Text Interpretation:  Sinus rhythm Prolonged PR interval Nonspecific intraventricular conduction delay Borderline ST depression, inferior leads No significant change since last tracing Confirmed by Richardean Canal [phone]) on 11/29/2016 2:54:49 PM       Radiology Ct Angio Head W Or Wo Contrast  Result Date: 11/29/2016 CLINICAL DATA:  Difficulty with speech. Hypertension. Follow-up code stroke. EXAM: CT ANGIOGRAPHY HEAD AND NECK TECHNIQUE: Multidetector CT imaging of the head and neck was performed using the  standard protocol during bolus administration of intravenous contrast. Multiplanar CT image reconstructions and MIPs were obtained to evaluate the vascular anatomy. Carotid stenosis measurements (when applicable) are obtained utilizing NASCET criteria, using the distal internal carotid diameter as the denominator. CONTRAST:  50 cc Isovue 370 intravenous COMPARISON:  Brain MRI 10/13/2015 FINDINGS: CTA NECK FINDINGS Aortic arch: No acute finding. Three vessel branching. The prominent diameter of the descending aorta, measurement limited by incomplete coverage. The descending aorta measures at least 3.8 cm on coronal reformats. Right carotid system: Vessels are smooth and widely patent. No noted atheromatous changes. Left carotid system: Vessels are smooth and widely patent. No noted atheromatous changes. Vertebral arteries: No proximal subclavian stenosis. Strongly dominant left vertebral artery. The diminutive right vertebral artery is faintly seen to the dura. Skeleton: No acute or aggressive finding. Other neck: Left thyroid mass measuring at least 5 cm, with tracheal deviation but no visible invasion. Upper chest: Negative Review of the MIP images confirms the above findings CTA HEAD FINDINGS Anterior circulation: No emergent large vessel occlusion. High-grade narrowing at the left proximal A2 segment. Moderate  atheromatous irregularity of bilateral MCA branches. No beaded appearance. Negative for aneurysm. Posterior circulation: Strongly left dominant vertebral artery. Right vertebral flow is into a diminutive right pica. Undulating basilar with mild stenosis, likely atheromatous. Moderate to advanced narrowing of the left P2 segment. Negative for aneurysm Venous sinuses: As permitted by contrast timing, patent. Anatomic variants: None significant. Delayed phase: Not performed in the emergent setting. Review of the MIP images confirms the above findings IMPRESSION: 1. No emergent large vessel occlusion. 2. Age advanced intracranial atherosclerosis with high-grade left A2 and P2 segment stenoses. Moderate atherosclerotic irregularity of bilateral MCA branches. 3. No flow limiting stenosis in the neck. Strongly left dominant vertebral artery. 4. 5 cm left thyroid mass, recommend sonography when clinically appropriate. 5. 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 Chest CT could be obtained when clinically appropriate. Electronically Signed   By: Marnee Spring M.D.   On: 11/29/2016 15:03   Ct Angio Neck W Or Wo Contrast  Result Date: 11/29/2016 CLINICAL DATA:  Difficulty with speech. Hypertension. Follow-up code stroke. EXAM: CT ANGIOGRAPHY HEAD AND NECK TECHNIQUE: Multidetector CT imaging of the head and neck was performed using the standard protocol during bolus administration of intravenous contrast. Multiplanar CT image reconstructions and MIPs were obtained to evaluate the vascular anatomy. Carotid stenosis measurements (when applicable) are obtained utilizing NASCET criteria, using the distal internal carotid diameter as the denominator. CONTRAST:  50 cc Isovue 370 intravenous COMPARISON:  Brain MRI 10/13/2015 FINDINGS: CTA NECK FINDINGS Aortic arch: No acute finding. Three vessel branching. The prominent diameter of the descending aorta, measurement limited by incomplete coverage. The  descending aorta measures at least 3.8 cm on coronal reformats. Right carotid system: Vessels are smooth and widely patent. No noted atheromatous changes. Left carotid system: Vessels are smooth and widely patent. No noted atheromatous changes. Vertebral arteries: No proximal subclavian stenosis. Strongly dominant left vertebral artery. The diminutive right vertebral artery is faintly seen to the dura. Skeleton: No acute or aggressive finding. Other neck: Left thyroid mass measuring at least 5 cm, with tracheal deviation but no visible invasion. Upper chest: Negative Review of the MIP images confirms the above findings CTA HEAD FINDINGS Anterior circulation: No emergent large vessel occlusion. High-grade narrowing at the left proximal A2 segment. Moderate atheromatous irregularity of bilateral MCA branches. No beaded appearance. Negative for aneurysm. Posterior circulation: Strongly left dominant vertebral artery.  Right vertebral flow is into a diminutive right pica. Undulating basilar with mild stenosis, likely atheromatous. Moderate to advanced narrowing of the left P2 segment. Negative for aneurysm Venous sinuses: As permitted by contrast timing, patent. Anatomic variants: None significant. Delayed phase: Not performed in the emergent setting. Review of the MIP images confirms the above findings IMPRESSION: 1. No emergent large vessel occlusion. 2. Age advanced intracranial atherosclerosis with high-grade left A2 and P2 segment stenoses. Moderate atherosclerotic irregularity of bilateral MCA branches. 3. No flow limiting stenosis in the neck. Strongly left dominant vertebral artery. 4. 5 cm left thyroid mass, recommend sonography when clinically appropriate. 5. 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 Chest CT could be obtained when clinically appropriate. Electronically Signed   By: Marnee Spring M.D.   On: 11/29/2016 15:03   [name]  Result Date:  11/29/2016 CLINICAL DATA:  Slurred speech.  Headache.  Hypertension. EXAM: MRI HEAD WITHOUT CONTRAST TECHNIQUE: Multiplanar, multiecho pulse sequences of the brain and surrounding structures were obtained without intravenous contrast. COMPARISON:  CTA head 11/29/2016 FINDINGS: Brain: The midline structures are normal. There is no focal diffusion restriction to indicate acute infarct. Multiple old corona radiata and centrum semiovale lacunar infarcts. There is diffuse confluent hyperintense T2-weighted signal within the periventricular, juxtacortical and deep white matter, most often seen in the setting of chronic microvascular ischemia. Scattered foci chronic microhemorrhage in a nonspecific distribution. Volume loss is advanced for age. Brain volume is normal for age without lobar predominant atrophy. The dura is normal and there is no extra-axial collection. Vascular: Major intracranial arterial and venous sinus flow voids are preserved. Skull and upper cervical spine: The visualized skull base, calvarium, upper cervical spine and extracranial soft tissues are normal. Sinuses/Orbits: No fluid levels or advanced mucosal thickening. No mastoid or middle ear effusion. Normal orbits. IMPRESSION: 1. No acute abnormality. 2. Severe chronic ischemic microangiopathy with numerous old centrum semiovale and corona radiata lacunar infarcts. 3. 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 Electronically Signed   By: Deatra Robinson M.D.   On: 11/29/2016 17:10   Ct Head Code Stroke Wo Contrast  Result Date: 11/29/2016 CLINICAL DATA:  Code stroke.  Speech difficulties.  Hypertension. EXAM: CT HEAD WITHOUT CONTRAST TECHNIQUE: Contiguous axial images were obtained from the base of the skull through the vertex without intravenous contrast. COMPARISON:  Brain MRI 10/13/2015 FINDINGS: Brain: No mass lesion or acute hemorrhage. No focal hypoattenuation of the basal ganglia or cortex to indicate infarcted tissue. No hydrocephalus or age advanced atrophy.  There is periventricular hypoattenuation compatible with chronic microvascular disease. Advanced atrophy for age. Vascular: No hyperdense vessel. No advanced atherosclerotic calcification of the arteries at the skull base. Skull: Normal visualized skull base, calvarium and extracranial soft tissues. Sinuses/Orbits: No sinus fluid levels or advanced mucosal thickening. No mastoid effusion. Normal orbits. ASPECTS [facility] Stroke Program Early CT Score) - Ganglionic level infarction (caudate, lentiform nuclei, internal capsule, insula, M1-M3 cortex): 7 - Supraganglionic infarction (M4-M6 cortex): 3 Total score (0-10 with 10 being normal): 10 IMPRESSION: 1. Severe chronic ischemic microangiopathy and age advanced atrophy. No acute abnormality. 2. ASPECTS is 10. These results were called by telephone at the time of interpretation on 11/29/2016 at 2:37 pm to PA [facility] Masiah Woody , who verbally acknowledged these results. Electronically Signed   By: Deatra Robinson M.D.   On: 11/29/2016 14:38    Procedures Procedures (including critical care time)  Medications Ordered in ED Medications  iopamidol (ISOVUE-370) 76 % injection (not administered)  sodium chloride 0.9 % bolus 500 mL (0 mLs Intravenous Stopped 11/29/16 1837)  lisinopril (PRINIVIL,ZESTRIL) tablet 20 mg (20 mg Oral Given 11/29/16 1737)  labetalol (NORMODYNE) tablet 200 mg (200 mg Oral Given 11/29/16 1738)  sodium chloride 0.9 % bolus 500 mL (0 mLs Intravenous Stopped 11/29/16 1925)     Initial Impression / Assessment and Plan / ED Course  I have reviewed the triage vital signs and the nursing notes.  Pertinent labs & imaging results that were available during my care of the patient were reviewed by me and considered in my medical decision making (see chart for details).     Patient presenting with aphasia. Code stroke called. CT head, injure head and neck, an MRI of the brain showed no acute findings, but significant white matter disease. Neurology  feels that his elevated blood pressure and AKI may be more contributory. Patient given home dose of labetalol and lisinopril in ED. Patient is back to baseline, however considering AKI and presented with symptoms, I consulted [facility] for observation admission. I spoke with Dr. Blake Divine who guided the patient's management and agrees with plan. Patient also evaluated by Dr. Silverio Lay who guided the patient's management and agrees with plan.  Final Clinical Impressions(s) / ED Diagnoses   Final diagnoses:  Aphasia  AKI (acute kidney injury) [facility])    New Prescriptions New Prescriptions   No medications on file         Verdis Prime 11/29/16 Ninfa Linden    Charlynne Pander, MD 12/01/16 1504

## 2016-11-29 NOTE — Consult Note (Addendum)
Requesting Physician: Dr. Silverio Lay    Chief Complaint: Code Stroke  History obtained from:  Chart     HPI:                                                                                                                                         Vincent Hogan is an 51 y.o. male presenting to ED due to family noting his speech was slurred and he did not feel well. On Exam he was non verbal other than whispering, eyes clenched tight, following simple commands but showing minimal effort throughout.  Complaining of HA. BP systolic in the 230.  Initial CT head was negative or any acute process but with a lot of atrophy and evidence of old strokes and chronic white matter disease.  Head CTA and Neck was negative.  MRI was not open for 40 minutes.  Patient was not a tPA candidate due to lack of localizing and lateralizing symptoms.   Date last known well: Date: 11/29/2016 Time last known well: Time: 13:00 tPA Given: No: minimal symptoms  Past Medical History:  Diagnosis Date  . Hypertension   . TIA (transient ischemic attack)     Past Surgical History:  Procedure Laterality Date  . TEE WITHOUT CARDIOVERSION N/A 10/16/2015   Procedure: TRANSESOPHAGEAL ECHOCARDIOGRAM (TEE);  Surgeon: Jake Bathe, MD;  Location[facility] ENDOSCOPY;  Service: Cardiovascular;  Laterality: N/A;  Patient is also getting a loop    Family History  Problem Relation Age of Onset  . Dementia Mother   . Stroke Father   . Hypertension Father   . Hypertension Sister   . Hypertension Brother   . Stroke Other   . CAD Neg Hx   . Cancer Neg Hx    Social History:  reports that he has never smoked. He has never used smokeless tobacco. He reports that he does not drink alcohol or use drugs.  Allergies:  Allergies  Allergen Reactions  . Lisinopril     REACTION: cough    Medications:                                                                                                                           Current  Facility-Administered Medications  Medication Dose Route Frequency Provider Last Rate Last Dose  . iopamidol (ISOVUE-370) 76 % injection  Current Outpatient Prescriptions  Medication Sig Dispense Refill  . aspirin 325 MG tablet Take 1 tablet (325 mg total) by mouth daily. With Food 30 tablet 2  . atorvastatin (LIPITOR) 80 MG tablet Take 1 tablet (80 mg total) by mouth daily at 6 PM. 30 tablet 1  . hydrochlorothiazide (HYDRODIURIL) 25 MG tablet Take 25 mg by mouth daily.    Marland Kitchen lisinopril (PRINIVIL,ZESTRIL) 20 MG tablet Take 20 mg by mouth daily.    . verapamil (CALAN) 40 MG tablet Take 40 mg by mouth daily.       ROS:                                                                                                                                       History obtained from unobtainable from patient due to patient not being verbal   Neurologic Examination:                                                                                                      Blood pressure (!) 209/130, pulse 66, temperature 97.7 F (36.5 C), temperature source Oral, resp. rate 20, height  (1.93 m), weight 108.9 kg (240 lb), SpO2 100 %.  HEENT-  Normocephalic, no lesions, without obvious abnormality.  Normal external eye and conjunctiva.  Normal TM's bilaterally.  Normal auditory canals and external ears. Normal external nose, mucus membranes and septum.  Normal pharynx. Cardiovascular- S1, S2 normal, pulses palpable throughout   Lungs- chest clear, no wheezing, rales, normal symmetric air entry, Heart exam - S1, S2 normal, no murmur, no gallop, rate regular Abdomen- normal findings: bowel sounds normal Extremities- no edema Lymph-no adenopathy palpable Musculoskeletal-no joint tenderness, deformity or swelling Skin-warm and dry, no hyperpigmentation, vitiligo, or suspicious lesions  Neurological Examination Mental Status: Alert, oriented, thought content appropriate.  Speech fluent without  evidence of aphasia.  Able to follow 3 step commands without difficulty. Cranial Nerves: II: Discs flat bilaterally; Visual fields grossly normal,  III,IV, VI: ptosis not present, extra-ocular motions intact bilaterally, pupils equal, round, reactive to light and accommodation V,VII: smile symmetric, facial light touch sensation normal bilaterally VIII: hearing normal bilaterally IX,X: uvula rises symmetrically XI: bilateral shoulder shrug XII: midline tongue extension Motor: Right : Upper extremity   5/5    Left:     Upper extremity   5/5  Lower extremity   5/5     Lower extremity   5/5 Tone and bulk:normal tone throughout; no atrophy noted Sensory:  Pinprick and light touch intact throughout, bilaterally Deep Tendon Reflexes: 2+ and symmetric throughout Plantars: Right: downgoing   Left: downgoing Cerebellar: normal finger-to-nose, normal rapid alternating movements and normal heel-to-shin test Gait: normal gait and station   Lab Results: Basic Metabolic Panel:  Recent Labs Lab 11/29/16 1427  NA 138  K 5.1  CL 104  GLUCOSE 142*  BUN 26*  CREATININE 2.00*   CBC:  Recent Labs Lab 11/29/16 1413 11/29/16 1427  WBC 5.8  --   NEUTROABS 4.6  --   HGB 12.6* 14.6  HCT 39.5 43.0  MCV 77.1*  --   PLT 220  --     Imaging: Noncontrast CT of the head shows chronic white matter disease and evidence of old infarcts. No acute changes. CTA of the head and neck is negative for evidence of large vessel occlusion Radiologist impression patient below. IMPRESSION: 1. No emergent large vessel occlusion. 2. Age advanced intracranial atherosclerosis with high-grade left A2 and P2 segment stenoses. Moderate atherosclerotic irregularity of bilateral MCA branches. 3. No flow limiting stenosis in the neck. Strongly left dominant vertebral artery. 4. 5 cm left thyroid mass, recommend sonography when clinically appropriate. 5. 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 Chest CT could be obtained when clinically appropriate.   Assessment and plan discussed with with attending physician and they are in agreement.    Felicie Morn PA-C [facility] [phone]  11/29/2016, 2:42 PM  Attending addendum Patient seen and examined as acute code stroke. Review the history and physical documented above.   Assessment: 51 y.o. male was a past medical history of multiple strokes, dementia, possibly depression, was in his usual therapy for this afternoon when he had difficulty talking and bringing his words out. EMS noted some right-sided facial droop. On our initial evaluation in the emergency room, the only pertinent findings are that patient was slow to respond to all questions, speech was hypophonic but clear. He had no focal weakness. He had a low NIH stroke scale and not a clearcut exam for a new acute stroke which precluded him from receiving IV TPA. CT angiogram of the head and neck was negative for any emergent large vessel occlusion precluding him from endovascular treatment. Most likely a stroke recrudescence in the setting of acute metabolic derangements including acute kidney injury. Since he has had multiple old strokes, one of the lower considerations on the differential could also be seizures.  Impression Evaluate for new strokes Most likely stroke recrudescence Toxic metabolic encephalopathy Evaluate for seizures  Stroke Risk Factors - hypertension, prior strokes  Recommendations . HgbA1c, fasting lipid panel . MRI brain without contrast. No need to obtain MRA as the patient is already received his CT angiogram . Frequent neuro checks . Echocardiogram . Prophylactic therapy-Antiplatelet med: Aspirin - dose  PO or  PR . Risk factor modification . Telemetry monitoring . PT consult, OT consult, Speech consult . EEG  Stroke neurology will follow with you.  Milon Dikes, MD [facility] [phone]  If 7pm to 7am, please call on call as listed on AMION.

## 2016-11-29 NOTE — ED Notes (Signed)
Pt transferred to CT.

## 2016-11-29 NOTE — ED Notes (Signed)
Patient transported to MRI 

## 2016-11-29 NOTE — Progress Notes (Signed)
NURSING PROGRESS NOTE  Vincent Nunn SpencerMRN: [id number] Admission Data: 11/29/2016 at 2040 Attending Provider: Kathlen Mody, MD PCP: No primary care provider on file. Code status: Full  Allergies:  Allergies  Allergen Reactions  . Lisinopril     REACTION: cough   Past Medical History:  Past Medical History:  Diagnosis Date  . Hypertension   . TIA (transient ischemic attack)    Past Surgical History:  Past Surgical History:  Procedure Laterality Date  . TEE WITHOUT CARDIOVERSION N/A 10/16/2015   Procedure: TRANSESOPHAGEAL ECHOCARDIOGRAM (TEE);  Surgeon: Jake Bathe, MD;  Location: [facility] ENDOSCOPY;  Service: Cardiovascular;  Laterality: N/A;  Patient is also getting a loop   Vincent Hogan is a 51 y.o. male patient, arrived to floor in room 5W27 via stretcher, transferred from ED. Patient alert and oriented X 4. No acute distress noted. Denies pain.   Vital signs: Oral temperature 97.9 F (36.6 C), Blood pressure 177/96, Pulse 58, RR 17, SpO2 99 % on room air.   Cardiac monitoring: Telemetry box 5W # 26 in place. Second verified by Bascom Levels, RN  IV access: Left and Right AC-saline locked; condition patent and no redness.  Skin: intact, no pressure ulcer noted in sacral area. Second verified by Romero Belling  Patient's ID armband verified with patient/ family, and in place. Information packet given to patient/ family. Fall risk assessed, SR up X2, patient/ family able to verbalize understanding of risks associated with falls and to call nurse or staff to assist before getting out of bed. Patient/ family oriented to room and equipment. Call bell within reach.

## 2016-11-29 NOTE — Code Documentation (Signed)
51 y.o. Male with PMHx of TIA, CVA and HTN who was stated to be in his normal state of health today. Per his brother, the patient walked about 1.5 miles to [employer]. Once at [facility], the patient ordered and sat down when he then became unable to speak coherently. The patient called his brother to report he was not feeling well. The brother stated that the patient was not able to get any words out and stated to give his phone to an employee to call 911. LKW 1300 hrs. EMS arrived and transported the patient to [facility] ED.   On arrival, the patient was assessed by the EDP who then called a code stroke. CT negative for any acute intracranial abnormalities. ASPECTS 10. CTA with no LVO. On assessment, pt with SBP's in the low 200's, brother reports he did not take his antihypertensives this morning, non-verbal except for a few whispers of mostly unintelligible speech and followed simple commands. Overall the patient had very poor effort on exam. NIHSS 6. See EMR for NIHSS and code stroke times. IV tPA not given d/t being too mild to treat. Not a thrombectomy candidate d/t no LVO. ED bedside handoff with ED RN Marylene Land.

## 2016-11-29 NOTE — ED Triage Notes (Signed)
Pt brought in by [facility] from [facility] where staff reports pt came in and ordered food with normal speech. Staff then noticed after pt was sitting that he was becoming more altered and called EMS. Upon arrival EMS reports pt diaphoretic, hypertensive, slow to respond, and lethargic.

## 2016-11-29 NOTE — Progress Notes (Signed)
Received report from Angela,RN in the ED.

## 2016-11-29 NOTE — H&P (Signed)
History and Physical    [name] BMW:[id number] DOB: [birth year] DOA: 11/29/2016  PCP: No primary care provider on file. I have personally briefly reviewed patient's old medical records in [facility]  Chief Complaint: altered mental status.   HPI: Vincent Hogan is a 51 y.o. male with medical history significant of  Recent CVA, hypertension, was not feeling well and family brought the patient to ED, code stroke was called. He underwent MRI of the brain and CT angio of the head and neck, which were not sig for new stroke. He was found to be in hypertensive crisis, and in acute renal failure and referred to [facility] for admission. On further questions, he is slow in speech, which according to brother is his baseline, denies any tingling or numbness inhis extremities, he denies any sob, chest pain, nausea, vomiting, abd pain, headache, blurry vision, dizziness, . He reports cough starting today. Lab work sig for acute renal failure with creatinine of 2, when his baseline Is abour 1.6.   Review of Systems: As per HPI otherwise 10 point review of systems negative.    Past Medical History:  Diagnosis Date  . Hypertension   . TIA (transient ischemic attack)     Past Surgical History:  Procedure Laterality Date  . TEE WITHOUT CARDIOVERSION N/A 10/16/2015   Procedure: TRANSESOPHAGEAL ECHOCARDIOGRAM (TEE);  Surgeon: Jake Bathe, MD;  Location: [facility] ENDOSCOPY;  Service: Cardiovascular;  Laterality: N/A;  Patient is also getting a loop     reports that he has never smoked. He has never used smokeless tobacco. He reports that he does not drink alcohol or use drugs.  Allergies  Allergen Reactions  . Lisinopril     REACTION: cough    Family History  Problem Relation Age of Onset  . Dementia Mother   . Stroke Father   . Hypertension Father   . Hypertension Sister   . Hypertension Brother   . Stroke Other   . CAD Neg Hx   . Cancer Neg Hx    Reviewed   Prior to Admission  medications   Medication Sig Start Date End Date Taking? Authorizing Provider  aspirin 325 MG tablet Take 1 tablet (325 mg total) by mouth daily. With Food 10/16/15  Yes Shon Hale, MD  atorvastatin (LIPITOR) 80 MG tablet Take 1 tablet (80 mg total) by mouth daily at 6 PM. 10/16/15   Emokpae, Courage, MD  chlorthalidone (HYGROTON) 25 MG tablet Take 25 mg by mouth daily.    [provider]  clopidogrel (PLAVIX) 75 MG tablet Take 75 mg by mouth daily.    [provider]  donepezil (ARICEPT) 5 MG tablet Take 5 mg by mouth See admin instructions.  once daily x14 days, then  daily - picked up 9/24    [provider]  labetalol (NORMODYNE) 200 MG tablet Take 200 mg by mouth 2 (two) times daily.    [provider]  lisinopril (PRINIVIL,ZESTRIL) 20 MG tablet Take 20 mg by mouth daily.    [provider]    Physical Exam: Vitals:   11/29/16 1500 11/29/16 1536 11/29/16 1731 11/29/16 1830  BP: (!) 180/99  (!) 193/108 (!) 203/115  Pulse: (!) 57  61 (!) 57  Resp: 13   (!) 23  Temp:  97.7 F (36.5 C)    TempSrc:      SpO2: 100%  99% 100%  Weight:      Height:  Constitutional: NAD, calm, comfortable Vitals:   11/29/16 1500 11/29/16 1536 11/29/16 1731 11/29/16 1830  BP: (!) 180/99  (!) 193/108 (!) 203/115  Pulse: (!) 57  61 (!) 57  Resp: 13   (!) 23  Temp:  97.7 F (36.5 C)    TempSrc:      SpO2: 100%  99% 100%  Weight:      Height:       Eyes: PERRL, lids and conjunctivae normal ENMT: Mucous membranes are moist. Posterior pharynx clear of any exudate or lesions.Normal dentition.  Neck: normal, supple, no masses, no thyromegaly Respiratory: clear to auscultation bilaterally, no wheezing, no crackles. Normal respiratory effort. No accessory muscle use.  Cardiovascular: Regular rate and rhythm, no murmurs / rubs / gallops. No extremity edema. 2+ pedal pulses. No carotid bruits.  Abdomen: no tenderness, no masses palpated. No  hepatosplenomegaly. Bowel sounds positive.  Musculoskeletal: no clubbing / cyanosis. No joint deformity upper and lower extremities. Good ROM, no contractures. Normal muscle tone.  Skin: no rashes, lesions, ulcers. No induration Neurologic: CN 2-12 grossly intact. Sensation intact, DTR normal. Strength 5/5 in all 4.  Psychiatric: Normal judgment and insight. Alert and oriented x 3. Normal mood.     Labs on Admission: I have personally reviewed following labs and imaging studies  CBC:  Recent Labs Lab 11/29/16 1413 11/29/16 1427  WBC 5.8  --   NEUTROABS 4.6  --   HGB 12.6* 14.6  HCT 39.5 43.0  MCV 77.1*  --   PLT 220  --    Basic Metabolic Panel:  Recent Labs Lab 11/29/16 1413 11/29/16 1427  NA 135 138  K 5.1 5.1  CL 102 104  CO2 24  --   GLUCOSE 143* 142*  BUN 21* 26*  CREATININE 2.00* 2.00*  CALCIUM 9.6  --    GFR: Estimated Creatinine Clearance: 59.1 mL/min (A) (by C-G formula based on SCr of 2 mg/dL (H)). Liver Function Tests:  Recent Labs Lab 11/29/16 1413  AST 31  ALT 28  ALKPHOS 97  BILITOT 0.9  PROT 8.8*  ALBUMIN 3.9   No results for input(s): LIPASE, AMYLASE in the last 168 hours. No results for input(s): AMMONIA in the last 168 hours. Coagulation Profile:  Recent Labs Lab 11/29/16 1413  INR 1.09   Cardiac Enzymes: No results for input(s): CKTOTAL, CKMB, CKMBINDEX, TROPONINI in the last 168 hours. BNP (last 3 results) No results for input(s): PROBNP in the last 8760 hours. HbA1C: No results for input(s): HGBA1C in the last 72 hours. CBG: No results for input(s): GLUCAP in the last 168 hours. Lipid Profile: No results for input(s): CHOL, HDL, LDLCALC, TRIG, CHOLHDL, LDLDIRECT in the last 72 hours. Thyroid Function Tests: No results for input(s): TSH, T4TOTAL, FREET4, T3FREE, THYROIDAB in the last 72 hours. Anemia Panel: No results for input(s): VITAMINB12, FOLATE, FERRITIN, TIBC, IRON, RETICCTPCT in the last 72 hours. Urine analysis:     Component Value Date/Time   COLORURINE YELLOW 10/13/2015 0030   APPEARANCEUR CLEAR 10/13/2015 0030   LABSPEC 1.017 10/13/2015 0030   PHURINE 5.5 10/13/2015 0030   GLUCOSEU NEGATIVE 10/13/2015 0030   HGBUR LARGE (A) 10/13/2015 0030   BILIRUBINUR NEGATIVE 10/13/2015 0030   KETONESUR NEGATIVE 10/13/2015 0030   PROTEINUR NEGATIVE 10/13/2015 0030   NITRITE NEGATIVE 10/13/2015 0030   LEUKOCYTESUR NEGATIVE 10/13/2015 0030    Radiological Exams on Admission: Ct Angio Head W Or Wo Contrast  Result Date: 11/29/2016 CLINICAL DATA:  Difficulty with speech. Hypertension. Follow-up  code stroke. EXAM: CT ANGIOGRAPHY HEAD AND NECK TECHNIQUE: Multidetector CT imaging of the head and neck was performed using the standard protocol during bolus administration of intravenous contrast. Multiplanar CT image reconstructions and MIPs were obtained to evaluate the vascular anatomy. Carotid stenosis measurements (when applicable) are obtained utilizing NASCET criteria, using the distal internal carotid diameter as the denominator. CONTRAST:  50 cc Isovue 370 intravenous COMPARISON:  Brain MRI 10/13/2015 FINDINGS: CTA NECK FINDINGS Aortic arch: No acute finding. Three vessel branching. The prominent diameter of the descending aorta, measurement limited by incomplete coverage. The descending aorta measures at least 3.8 cm on coronal reformats. Right carotid system: Vessels are smooth and widely patent. No noted atheromatous changes. Left carotid system: Vessels are smooth and widely patent. No noted atheromatous changes. Vertebral arteries: No proximal subclavian stenosis. Strongly dominant left vertebral artery. The diminutive right vertebral artery is faintly seen to the dura. Skeleton: No acute or aggressive finding. Other neck: Left thyroid mass measuring at least 5 cm, with tracheal deviation but no visible invasion. Upper chest: Negative Review of the MIP images confirms the above findings CTA HEAD FINDINGS Anterior  circulation: No emergent large vessel occlusion. High-grade narrowing at the left proximal A2 segment. Moderate atheromatous irregularity of bilateral MCA branches. No beaded appearance. Negative for aneurysm. Posterior circulation: Strongly left dominant vertebral artery. Right vertebral flow is into a diminutive right pica. Undulating basilar with mild stenosis, likely atheromatous. Moderate to advanced narrowing of the left P2 segment. Negative for aneurysm Venous sinuses: As permitted by contrast timing, patent. Anatomic variants: None significant. Delayed phase: Not performed in the emergent setting. Review of the MIP images confirms the above findings IMPRESSION: 1. No emergent large vessel occlusion. 2. Age advanced intracranial atherosclerosis with high-grade left A2 and P2 segment stenoses. Moderate atherosclerotic irregularity of bilateral MCA branches. 3. No flow limiting stenosis in the neck. Strongly left dominant vertebral artery. 4. 5 cm left thyroid mass, recommend sonography when clinically appropriate. 5. 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 Chest CT could be obtained when clinically appropriate. Electronically Signed   By: Marnee Spring M.D.   On: 11/29/2016 15:03   Ct Angio Neck W Or Wo Contrast  Result Date: 11/29/2016 CLINICAL DATA:  Difficulty with speech. Hypertension. Follow-up code stroke. EXAM: CT ANGIOGRAPHY HEAD AND NECK TECHNIQUE: Multidetector CT imaging of the head and neck was performed using the standard protocol during bolus administration of intravenous contrast. Multiplanar CT image reconstructions and MIPs were obtained to evaluate the vascular anatomy. Carotid stenosis measurements (when applicable) are obtained utilizing NASCET criteria, using the distal internal carotid diameter as the denominator. CONTRAST:  50 cc Isovue 370 intravenous COMPARISON:  Brain MRI 10/13/2015 FINDINGS: CTA NECK FINDINGS Aortic arch: No acute finding. Three  vessel branching. The prominent diameter of the descending aorta, measurement limited by incomplete coverage. The descending aorta measures at least 3.8 cm on coronal reformats. Right carotid system: Vessels are smooth and widely patent. No noted atheromatous changes. Left carotid system: Vessels are smooth and widely patent. No noted atheromatous changes. Vertebral arteries: No proximal subclavian stenosis. Strongly dominant left vertebral artery. The diminutive right vertebral artery is faintly seen to the dura. Skeleton: No acute or aggressive finding. Other neck: Left thyroid mass measuring at least 5 cm, with tracheal deviation but no visible invasion. Upper chest: Negative Review of the MIP images confirms the above findings CTA HEAD FINDINGS Anterior circulation: No emergent large vessel occlusion. High-grade narrowing at the left proximal A2  segment. Moderate atheromatous irregularity of bilateral MCA branches. No beaded appearance. Negative for aneurysm. Posterior circulation: Strongly left dominant vertebral artery. Right vertebral flow is into a diminutive right pica. Undulating basilar with mild stenosis, likely atheromatous. Moderate to advanced narrowing of the left P2 segment. Negative for aneurysm Venous sinuses: As permitted by contrast timing, patent. Anatomic variants: None significant. Delayed phase: Not performed in the emergent setting. Review of the MIP images confirms the above findings IMPRESSION: 1. No emergent large vessel occlusion. 2. Age advanced intracranial atherosclerosis with high-grade left A2 and P2 segment stenoses. Moderate atherosclerotic irregularity of bilateral MCA branches. 3. No flow limiting stenosis in the neck. Strongly left dominant vertebral artery. 4. 5 cm left thyroid mass, recommend sonography when clinically appropriate. 5. 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 Chest CT could be obtained when clinically appropriate. Electronically  Signed   By: Marnee Spring M.D.   On: 11/29/2016 15:03   [name]  Result Date: 11/29/2016 CLINICAL DATA:  Slurred speech.  Headache.  Hypertension. EXAM: MRI HEAD WITHOUT CONTRAST TECHNIQUE: Multiplanar, multiecho pulse sequences of the brain and surrounding structures were obtained without intravenous contrast. COMPARISON:  CTA head 11/29/2016 FINDINGS: Brain: The midline structures are normal. There is no focal diffusion restriction to indicate acute infarct. Multiple old corona radiata and centrum semiovale lacunar infarcts. There is diffuse confluent hyperintense T2-weighted signal within the periventricular, juxtacortical and deep white matter, most often seen in the setting of chronic microvascular ischemia. Scattered foci chronic microhemorrhage in a nonspecific distribution. Volume loss is advanced for age. Brain volume is normal for age without lobar predominant atrophy. The dura is normal and there is no extra-axial collection. Vascular: Major intracranial arterial and venous sinus flow voids are preserved. Skull and upper cervical spine: The visualized skull base, calvarium, upper cervical spine and extracranial soft tissues are normal. Sinuses/Orbits: No fluid levels or advanced mucosal thickening. No mastoid or middle ear effusion. Normal orbits. IMPRESSION: 1. No acute abnormality. 2. Severe chronic ischemic microangiopathy with numerous old centrum semiovale and corona radiata lacunar infarcts. 3. 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 Electronically Signed   By: Deatra Robinson M.D.   On: 11/29/2016 17:10   Ct Head Code Stroke Wo Contrast  Result Date: 11/29/2016 CLINICAL DATA:  Code stroke.  Speech difficulties.  Hypertension. EXAM: CT HEAD WITHOUT CONTRAST TECHNIQUE: Contiguous axial images were obtained from the base of the skull through the vertex without intravenous contrast. COMPARISON:  Brain MRI 10/13/2015 FINDINGS: Brain: No mass lesion or acute hemorrhage. No focal hypoattenuation of  the basal ganglia or cortex to indicate infarcted tissue. No hydrocephalus or age advanced atrophy. There is periventricular hypoattenuation compatible with chronic microvascular disease. Advanced atrophy for age. Vascular: No hyperdense vessel. No advanced atherosclerotic calcification of the arteries at the skull base. Skull: Normal visualized skull base, calvarium and extracranial soft tissues. Sinuses/Orbits: No sinus fluid levels or advanced mucosal thickening. No mastoid effusion. Normal orbits. ASPECTS [facility] Stroke Program Early CT Score) - Ganglionic level infarction (caudate, lentiform nuclei, internal capsule, insula, M1-M3 cortex): 7 - Supraganglionic infarction (M4-M6 cortex): 3 Total score (0-10 with 10 being normal): 10 IMPRESSION: 1. Severe chronic ischemic microangiopathy and age advanced atrophy. No acute abnormality. 2. ASPECTS is 10. These results were called by telephone at the time of interpretation on 11/29/2016 at 2:37 pm to PA [facility] LAW , who verbally acknowledged these results. Electronically Signed   By: Deatra Robinson M.D.   On: 11/29/2016 14:38  EKG: sinus rhythm.   Assessment/Plan Active Problems:   Acute encephalopathy    Acute encephalopathy:  Unclear etiology, suspect metabolic , inview of the acute renal failure and hypertensive urgency. Differential include TIA. He appears to be improving, as he is able to answer simple questions and following commands.  Initial MRI and CT angio of the head and neck not significant for new stroke. Neurology consulted and recommendations given.  Complete TIA work up.  Resume aspirin 325 mg daily.  EEG ordered.     Hypertensive crisis:  Improving with resumption of bp meds.  Pt reports non compliant to meds.  Add hydralazine IV prn.  Stop lisinopril.     Acute kidney injury' Baseline creatinine at 1.6 to 1.7.  Slight worsening of creatinine to 2.  Get UA. Check renal ultrasound. Get urine electrolytes.    Encourage compliance to meds. Gently hydrate and repeat renal parameters in am.    Cough. Clear to auscultation.  Get CXR .      DVT prophylaxis: LOVENOX. Code Status: FULL CODE. Family Communication: BROTHER AT BEDSIDE.  Disposition Plan: D/C IN AM IF BP AND RENAL PARAMETERS IMPROVE. Consults called: neurology Admission status:obs/ tele.   Kathlen Mody MD [facility] Pager [phone]  If 7PM-7AM, please contact night-coverage [email or web address] Password [email or web address]  11/29/2016, 7:10 PM

## 2016-11-30 ENCOUNTER — Observation Stay (HOSPITAL_COMMUNITY): Payer: BC Managed Care – PPO

## 2016-11-30 DIAGNOSIS — N183 Chronic kidney disease, stage 3 (moderate): Secondary | ICD-10-CM | POA: Diagnosis not present

## 2016-11-30 DIAGNOSIS — G9340 Encephalopathy, unspecified: Secondary | ICD-10-CM | POA: Diagnosis not present

## 2016-11-30 DIAGNOSIS — N179 Acute kidney failure, unspecified: Secondary | ICD-10-CM | POA: Diagnosis not present

## 2016-11-30 DIAGNOSIS — I6300 Cerebral infarction due to thrombosis of unspecified precerebral artery: Secondary | ICD-10-CM | POA: Diagnosis not present

## 2016-11-30 LAB — BASIC METABOLIC PANEL WITH GFR
Anion gap: 9 (ref 5–15)
BUN: 19 mg/dL (ref 6–20)
CO2: 22 mmol/L (ref 22–32)
Calcium: 9.3 mg/dL (ref 8.9–10.3)
Chloride: 104 mmol/L (ref 101–111)
Creatinine, Ser: 1.69 mg/dL — ABNORMAL HIGH (ref 0.61–1.24)
GFR calc Af Amer: 52 mL/min — ABNORMAL LOW
GFR calc non Af Amer: 45 mL/min — ABNORMAL LOW
Glucose, Bld: 98 mg/dL (ref 65–99)
Potassium: 4.2 mmol/L (ref 3.5–5.1)
Sodium: 135 mmol/L (ref 135–145)

## 2016-11-30 LAB — LIPID PANEL
CHOLESTEROL: 129 mg/dL (ref 0–200)
HDL: 33 mg/dL — ABNORMAL LOW (ref >40)
LDL Cholesterol: 74 mg/dL (ref 0–99)
Total CHOL/HDL Ratio: 3.9 RATIO
Triglycerides: 109 mg/dL (ref <150)
VLDL: 22 mg/dL (ref 0–40)

## 2016-11-30 LAB — HEMOGLOBIN A1C
HEMOGLOBIN A1C: 6.3 % — AB (ref 4.8–5.6)
MEAN PLASMA GLUCOSE: 134.11 mg/dL

## 2016-11-30 LAB — SODIUM, URINE, RANDOM: Sodium, Ur: 101 mmol/L

## 2016-11-30 LAB — CREATININE, URINE, RANDOM: Creatinine, Urine: 71.92 mg/dL

## 2016-11-30 LAB — HIV ANTIBODY (ROUTINE TESTING W REFLEX): HIV SCREEN 4TH GENERATION: NONREACTIVE

## 2016-11-30 MED ORDER — ENOXAPARIN SODIUM 40 MG/0.4ML ~~LOC~~ SOLN
40.0000 mg | SUBCUTANEOUS | Status: DC
  Administered 2016-11-30: 40 mg via SUBCUTANEOUS
  Filled 2016-11-30: qty 0.4

## 2016-11-30 NOTE — Progress Notes (Signed)
EEG completed; results pending.    

## 2016-11-30 NOTE — Progress Notes (Signed)
PROGRESS NOTE    Vincent Hogan  ZOX:[id number] DOB: February 08, 1966 DOA: 11/29/2016 PCP: No primary care provider on file.  Brief Narrative:Vincent Hogan is a 51 y.o. male with medical history significant of  Recent CVA, hypertension, was not feeling well and family brought the patient to ED, code stroke was called. He underwent MRI of the brain and CT angio of the head and neck, which were not sig for new stroke. He was found to be in hypertensive crisis, and in acute renal failure.   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 -MRI negative for acute CVA -MRA without large vessel occlusions -ECHO pending -CTA neck : Intracranial atherosclerosis severe in left A2 and P2, moderate in bilateral MCA, no emergent occlusion -LDL 74, HgbA1c 6.3% -PT/OT/SLP pending  Hypertensive crises -improved -BP stabilized now, pt admits running out of meds few days ago -stop ACE due to AKI -resumed HCTZ  AKI on CKD3 -baseline creatinine around 1.6, admission creat was 2 -improving back to baseline, stopped ACE  Vascular dementia -mild -continue aricept  DVT prophylaxis:lovenox Code Status: Full Code Family Communication: None at bedside Disposition Plan: Home pending workup  Consultants:  NEuro   Subjective: Feels ok speech back to baseline  Objective: Vitals:   11/29/16 2046 11/29/16 2326 11/30/16 0537 11/30/16 1454  BP: (!) 177/96 (!) 165/80 (!) 165/93 (!) 142/91  Pulse: (!) 58 (!) 57 64 76  Resp:   17 16  Temp:   98 F (36.7 C) 98.5 F (36.9 C)  TempSrc:   Oral Oral  SpO2: 99%  98% 97%  Weight:   117.7 kg (259 lb 6.4 oz)   Height:        Intake/Output Summary (Last 24 hours) at 11/30/16 1728 Last data filed at 11/30/16 1452  Gross per 24 hour  Intake             1620 ml  Output             1625 ml  Net               -5 ml   Filed Weights   11/29/16 1353  11/30/16 0537  Weight: 108.9 kg (240 lb) 117.7 kg (259 lb 6.4 oz)    Examination:  General exam: Appears calm and comfortable, mild cognitive deficits  Respiratory system: Clear to auscultation. Respiratory effort normal. Cardiovascular system: S1 & S2 heard, RRR. No JVD, murmurs, rubs, gallops Gastrointestinal system: Abdomen is nondistended, soft and nontender.Normal bowel sounds heard. Central nervous system: Alert and oriented, but slow to answers, no focal neurological deficits. Extremities: Symmetric 5 x 5 power. Skin: No rashes, lesions or ulcers Psychiatry: Judgement and insight appear normal. Mood & affect appropriate.     Data Reviewed:   CBC:  Recent Labs Lab 11/29/16 1413 11/29/16 1427 11/29/16 2142  WBC 5.8  --  5.5  NEUTROABS 4.6  --   --   HGB 12.6* 14.6 12.2*  HCT 39.5 43.0 36.7*  MCV 77.1*  --  77.3*  PLT 220  --  214   Basic Metabolic Panel:  Recent Labs Lab 11/29/16 1413 11/29/16 1427 11/29/16 2142 11/30/16 0601  NA 135 138  --  135  K 5.1 5.1  --  4.2  CL 102 104  --  104  CO2 24  --   --  22  GLUCOSE 143* 142*  --  98  BUN 21* 26*  --  19  CREATININE 2.00* 2.00* 1.82* 1.69*  CALCIUM 9.6  --   --  9.3   GFR: Estimated Creatinine Clearance: 72.6 mL/min (A) (by C-G formula based on SCr of 1.69 mg/dL (H)). Liver Function Tests:  Recent Labs Lab 11/29/16 1413  AST 31  ALT 28  ALKPHOS 97  BILITOT 0.9  PROT 8.8*  ALBUMIN 3.9   No results for input(s): LIPASE, AMYLASE in the last 168 hours. No results for input(s): AMMONIA in the last 168 hours. Coagulation Profile:  Recent Labs Lab 11/29/16 1413  INR 1.09   Cardiac Enzymes: No results for input(s): CKTOTAL, CKMB, CKMBINDEX, TROPONINI in the last 168 hours. BNP (last 3 results) No results for input(s): PROBNP in the last 8760 hours. HbA1C:  Recent Labs  11/30/16 0601  HGBA1C 6.3*   CBG: No results for input(s): GLUCAP in the last 168 hours. Lipid Profile:  Recent  Labs  11/30/16 0601  CHOL 129  HDL 33*  LDLCALC 74  TRIG 161  CHOLHDL 3.9   Thyroid Function Tests: No results for input(s): TSH, T4TOTAL, FREET4, T3FREE, THYROIDAB in the last 72 hours. Anemia Panel: No results for input(s): VITAMINB12, FOLATE, FERRITIN, TIBC, IRON, RETICCTPCT in the last 72 hours. Urine analysis:    Component Value Date/Time   COLORURINE YELLOW 11/29/2016 1857   APPEARANCEUR CLEAR 11/29/2016 1857   LABSPEC 1.023 11/29/2016 1857   PHURINE 5.0 11/29/2016 1857   GLUCOSEU NEGATIVE 11/29/2016 1857   HGBUR NEGATIVE 11/29/2016 1857   BILIRUBINUR NEGATIVE 11/29/2016 1857   KETONESUR NEGATIVE 11/29/2016 1857   PROTEINUR 30 (A) 11/29/2016 1857   NITRITE NEGATIVE 11/29/2016 1857   LEUKOCYTESUR NEGATIVE 11/29/2016 1857   Sepsis Labs: (procalcitonin:4,lacticidven:4)  )No results found for this or any previous visit (from the past 240 hour(s)).       Radiology Studies: Ct Angio Head W Or Wo Contrast  Result Date: 11/29/2016 CLINICAL DATA:  Difficulty with speech. Hypertension. Follow-up code stroke. EXAM: CT ANGIOGRAPHY HEAD AND NECK TECHNIQUE: Multidetector CT imaging of the head and neck was performed using the standard protocol during bolus administration of intravenous contrast. Multiplanar CT image reconstructions and MIPs were obtained to evaluate the vascular anatomy. Carotid stenosis measurements (when applicable) are obtained utilizing NASCET criteria, using the distal internal carotid diameter as the denominator. CONTRAST:  50 cc Isovue 370 intravenous COMPARISON:  Brain MRI 10/13/2015 FINDINGS: CTA NECK FINDINGS Aortic arch: No acute finding. Three vessel branching. The prominent diameter of the descending aorta, measurement limited by incomplete coverage. The descending aorta measures at least 3.8 cm on coronal reformats. Right carotid system: Vessels are smooth and widely patent. No noted atheromatous changes. Left carotid system: Vessels are smooth  and widely patent. No noted atheromatous changes. Vertebral arteries: No proximal subclavian stenosis. Strongly dominant left vertebral artery. The diminutive right vertebral artery is faintly seen to the dura. Skeleton: No acute or aggressive finding. Other neck: Left thyroid mass measuring at least 5 cm, with tracheal deviation but no visible invasion. Upper chest: Negative Review of the MIP images confirms the above findings CTA HEAD FINDINGS Anterior circulation: No emergent large vessel occlusion. High-grade narrowing at the left proximal A2 segment. Moderate atheromatous irregularity of bilateral MCA branches. No beaded appearance. Negative for aneurysm. Posterior circulation: Strongly left dominant vertebral artery. Right vertebral flow is into a diminutive right pica. Undulating basilar with mild stenosis, likely atheromatous. Moderate to advanced narrowing of the left P2 segment. Negative for aneurysm Venous  sinuses: As permitted by contrast timing, patent. Anatomic variants: None significant. Delayed phase: Not performed in the emergent setting. Review of the MIP images confirms the above findings IMPRESSION: 1. No emergent large vessel occlusion. 2. Age advanced intracranial atherosclerosis with high-grade left A2 and P2 segment stenoses. Moderate atherosclerotic irregularity of bilateral MCA branches. 3. No flow limiting stenosis in the neck. Strongly left dominant vertebral artery. 4. 5 cm left thyroid mass, recommend sonography when clinically appropriate. 5. 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 Chest CT could be obtained when clinically appropriate. Electronically Signed   By: Marnee Spring M.D.   On: 11/29/2016 15:03   Dg Chest 2 View  Result Date: 11/29/2016 CLINICAL DATA:  Altered mental status x1 day EXAM: CHEST  2 VIEW COMPARISON:  10/13/2015 FINDINGS: No acute consolidation or pleural effusion. Borderline cardiomegaly. No pneumothorax. IMPRESSION: No active  cardiopulmonary disease. Electronically Signed   By: Jasmine Pang M.D.   On: 11/29/2016 22:29   Ct Angio Neck W Or Wo Contrast  Result Date: 11/29/2016 CLINICAL DATA:  Difficulty with speech. Hypertension. Follow-up code stroke. EXAM: CT ANGIOGRAPHY HEAD AND NECK TECHNIQUE: Multidetector CT imaging of the head and neck was performed using the standard protocol during bolus administration of intravenous contrast. Multiplanar CT image reconstructions and MIPs were obtained to evaluate the vascular anatomy. Carotid stenosis measurements (when applicable) are obtained utilizing NASCET criteria, using the distal internal carotid diameter as the denominator. CONTRAST:  50 cc Isovue 370 intravenous COMPARISON:  Brain MRI 10/13/2015 FINDINGS: CTA NECK FINDINGS Aortic arch: No acute finding. Three vessel branching. The prominent diameter of the descending aorta, measurement limited by incomplete coverage. The descending aorta measures at least 3.8 cm on coronal reformats. Right carotid system: Vessels are smooth and widely patent. No noted atheromatous changes. Left carotid system: Vessels are smooth and widely patent. No noted atheromatous changes. Vertebral arteries: No proximal subclavian stenosis. Strongly dominant left vertebral artery. The diminutive right vertebral artery is faintly seen to the dura. Skeleton: No acute or aggressive finding. Other neck: Left thyroid mass measuring at least 5 cm, with tracheal deviation but no visible invasion. Upper chest: Negative Review of the MIP images confirms the above findings CTA HEAD FINDINGS Anterior circulation: No emergent large vessel occlusion. High-grade narrowing at the left proximal A2 segment. Moderate atheromatous irregularity of bilateral MCA branches. No beaded appearance. Negative for aneurysm. Posterior circulation: Strongly left dominant vertebral artery. Right vertebral flow is into a diminutive right pica. Undulating basilar with mild stenosis, likely  atheromatous. Moderate to advanced narrowing of the left P2 segment. Negative for aneurysm Venous sinuses: As permitted by contrast timing, patent. Anatomic variants: None significant. Delayed phase: Not performed in the emergent setting. Review of the MIP images confirms the above findings IMPRESSION: 1. No emergent large vessel occlusion. 2. Age advanced intracranial atherosclerosis with high-grade left A2 and P2 segment stenoses. Moderate atherosclerotic irregularity of bilateral MCA branches. 3. No flow limiting stenosis in the neck. Strongly left dominant vertebral artery. 4. 5 cm left thyroid mass, recommend sonography when clinically appropriate. 5. 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 Chest CT could be obtained when clinically appropriate. Electronically Signed   By: Marnee Spring M.D.   On: 11/29/2016 15:03   [name]  Result Date: 11/29/2016 CLINICAL DATA:  Slurred speech.  Headache.  Hypertension. EXAM: MRI HEAD WITHOUT CONTRAST TECHNIQUE: Multiplanar, multiecho pulse sequences of the brain and surrounding structures were obtained without intravenous contrast. COMPARISON:  CTA head 11/29/2016 FINDINGS: Brain: The midline structures are normal. There is no focal diffusion restriction to indicate acute infarct. Multiple old corona radiata and centrum semiovale lacunar infarcts. There is diffuse confluent hyperintense T2-weighted signal within the periventricular, juxtacortical and deep white matter, most often seen in the setting of chronic microvascular ischemia. Scattered foci chronic microhemorrhage in a nonspecific distribution. Volume loss is advanced for age. Brain volume is normal for age without lobar predominant atrophy. The dura is normal and there is no extra-axial collection. Vascular: Major intracranial arterial and venous sinus flow voids are preserved. Skull and upper cervical spine: The visualized skull base, calvarium, upper cervical spine  and extracranial soft tissues are normal. Sinuses/Orbits: No fluid levels or advanced mucosal thickening. No mastoid or middle ear effusion. Normal orbits. IMPRESSION: 1. No acute abnormality. 2. Severe chronic ischemic microangiopathy with numerous old centrum semiovale and corona radiata lacunar infarcts. 3. 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 Electronically Signed   By: Kevin  Herman M.D.   On: 11/29/2016 17:10   Us Renal  Result Date: 11/29/2016 CLINICAL DATA:  Acute renal insufficiency. Hypertensive crisis and weakness. EXAM: RENAL / URINARY TRACT ULTRASOUND COMPLETE COMPARISON:  10/13/2015 FINDINGS: Right Kidney: Length: 11.6 cm. Echogenicity within normal limits. Small cyst over the mid and upper pole with the larger over the mid pole measuring 1.6 cm. No mass or hydronephrosis visualized. Left Kidney: Length: 11.0 cm. Echogenicity within normal limits. 2 cm cyst over the mid pole. No mass or hydronephrosis visualized. Bladder: Appears normal for degree of bladder distention. IMPRESSION: Normal size kidneys without hydronephrosis. Small bilateral renal cysts. Electronically Signed   By: Daniel  Boyle M.D.   On: 11/29/2016 22:50   Ct Head Code Stroke Wo Contrast  Result Date: 11/29/2016 CLINICAL DATA:  Code stroke.  Speech difficulties.  Hypertension. EXAM: CT HEAD WITHOUT CONTRAST TECHNIQUE: Contiguous axial images were obtained from the base of the skull through the vertex without intravenous contrast. COMPARISON:  Brain MRI 10/13/2015 FINDINGS: Brain: No mass lesion or acute hemorrhage. No focal hypoattenuation of the basal ganglia or cortex to indicate infarcted tissue. No hydrocephalus or age advanced atrophy. There is periventricular hypoattenuation compatible with chronic microvascular disease. Advanced atrophy for age. Vascular: No hyperdense vessel. No advanced atherosclerotic calcification of the arteries at the skull base. Skull: Normal visualized skull base, calvarium and extracranial soft tissues.  Sinuses/Orbits: No sinus fluid levels or advanced mucosal thickening. No mastoid effusion. Normal orbits. ASPECTS (Alberta Stroke Program Early CT Score) - Ganglionic level infarction (caudate, lentiform nuclei, internal capsule, insula, M1-M3 cortex): 7 - Supraganglionic infarction (M4-M6 cortex): 3 Total score (0-10 with 10 being normal): 10 IMPRESSION: 1. Severe chronic ischemic microangiopathy and age advanced atrophy. No acute abnormality. 2. ASPECTS is 10. These results were called by telephone at the time of interpretation on 11/29/2016 at 2:37 pm to PA ALEXANDRA LAW , who verbally acknowledged these results. Electronically Signed   By: Kevin  Herman M.D.   On: 11/29/2016 14:38        Scheduled Meds: . aspirin  325 mg Oral Daily  . atorvastatin  80 mg Oral q1800  . chlorthalidone  25 mg Oral Daily  . clopidogrel  75 mg Oral Daily  . [START ON 12/05/2016] donepezil  10 mg Oral QHS  . donepezil  5 mg Oral QHS  . enoxaparin (LOVENOX) injection  40 mg Subcutaneous Q24H  . labetalol  200 mg Oral BID   Continuous Infusions:   LOS: 0 days    Time spent: <MEASUREM T>  Zannie Cove, MD [facility] Page via [email or web address], password [email or web address]  If 7PM-7AM, please contact night-coverage [email or web address] Password [email or web address] 11/30/2016, 5:28 PM

## 2016-11-30 NOTE — Progress Notes (Signed)
Visited with patient who shared concerns about his children while he is here in hospital, Prayer with patient, Gave Advanced Directives info to him for his consideration. Please request chaplain if he decides to use them.   11/30/16 1100  Clinical Encounter Type  Visited With Patient  Visit Type Initial;Spiritual support  Referral From Physician  Spiritual Encounters  Spiritual Needs Prayer  Stress Factors  Patient Stress Factors Other (Comment)  Family Stress Factors (concerned for his children)  Advance Directives (For Healthcare)  Does Patient Have a Medical Advance Directive? No  Would patient like information on creating a medical advance directive? Yes (Inpatient - patient requests chaplain consult to create a medical advance directive)  Phebe Colla, Chaplain

## 2016-11-30 NOTE — Procedures (Signed)
ELECTROENCEPHALOGRAM REPORT  Date of Study: 11/30/2016  Patient's Name: Vincent Hogan MRN: [id number] Date of Birth: [birth year]  Referring Provider: Kathlen Mody, MD  Clinical History: 51 year old male with history of stroke presents with altered mental status  Medications: aspirin tablet 325 mg   atorvastatin (LIPITOR) tablet 80 mg  chlorthalidone (HYGROTON) tablet 25 mg  clopidogrel (PLAVIX) tablet 75 mg  donepezil (ARICEPT) tablet 10 mg  enoxaparin (LOVENOX) injection 30 mg  hydrALAZINE (APRESOLINE) injection 5 mg  labetalol (NORMODYNE) tablet 200 mg   Technical Summary: A multichannel digital EEG recording measured by the international 10-20 system with electrodes applied with paste and impedances below 5000 ohms performed in our laboratory with EKG monitoring in a drowsy and asleep patient.  Hyperventilation and photic stimulation were not performed.  The digital EEG was referentially recorded, reformatted, and digitally filtered in a variety of bipolar and referential montages for optimal display.    Description: The patient is drowsy and asleep during the recording.  During maximal wakefulness, there is a symmetric, medium voltage 10 Hz posterior dominant rhythm that attenuates with eye opening.  The record is symmetric.  During drowsiness and sleep, there is an increase in theta slowing of the background.  Vertex waves and symmetric sleep spindles were seen.  There were no epileptiform discharges or electrographic seizures seen.    EKG lead was unremarkable.  Impression: This drowsy and asleep EEG is normal.    Clinical Correlation: A normal EEG does not exclude a clinical diagnosis of epilepsy.  If further clinical questions remain, prolonged EEG may be helpful.  Clinical correlation is advised.  Shon Millet, DO

## 2016-11-30 NOTE — Evaluation (Signed)
Physical Therapy Evaluation Patient Details Name: Vincent Hogan MRN: [id number] DOB: 28-Jul-1965 Today's Date: 11/30/2016   History of Present Illness  Vincent Hogan is a 51 y.o. male with medical history significant of  Recent CVA, hypertension.  Pt not feeling well and family brought the patient to ED, code stroke was called. He underwent MRI of the brain and CT angio of the head and neck, which were negative for new stroke. He was found to be in hypertensive crisis, and in acute renal failure.  Clinical Impression  Pt is at or close to baseline functioning and should be safe at home with PRN family assist. There are no further acute PT needs.  Will sign off at this time.     Follow Up Recommendations No PT follow up    Equipment Recommendations  None recommended by PT    Recommendations for Other Services       Precautions / Restrictions Precautions Precautions:  (mild fall risk)      Mobility  Bed Mobility Overal bed mobility: Independent                Transfers Overall transfer level: Modified independent                  Ambulation/Gait Ambulation/Gait assistance: Independent Ambulation Distance (Feet): 200 Feet Assistive device: None Gait Pattern/deviations: Step-through pattern   Gait velocity interpretation: Below normal speed for age/gender General Gait Details: slower and steady with mild baseline stiffness and inccordination with doesn't allow pt to change speeds or react quickly  Stairs Stairs: Yes   Stair Management: One rail Right;Alternating pattern;Forwards Number of Stairs: 3 General stair comments: slow, guard, but steady and safe with rails  Wheelchair Mobility    Modified Rankin (Stroke Patients Only)       Balance Overall balance assessment: Needs assistance Sitting-balance support: No upper extremity supported Sitting balance-Leahy Scale: Normal     Standing balance support: No upper extremity  supported Standing balance-Leahy Scale: Fair                               Pertinent Vitals/Pain Pain Assessment: No/denies pain    Home Living Family/patient expects to be discharged to:: Private residence Living Arrangements: Alone Available Help at Discharge: Family;Available PRN/intermittently (brother) Type of Home: Apartment Home Access: Level entry     Home Layout: One level Home Equipment: Walker - 2 wheels;Cane - single point      Prior Function Level of Independence: Independent         Comments: doesn't use AD's, takes public transportation when brother not available.  Brother takes him to get groceries, etc.     Higher education careers adviser        Extremity/Trunk Assessment   Upper Extremity Assessment Upper Extremity Assessment: Overall WFL for tasks assessed    Lower Extremity Assessment Lower Extremity Assessment: Overall WFL for tasks assessed (mild weakness in large muscle groups, but functional)       Communication   Communication: No difficulties;Other (comment) (slow speech)  Cognition Arousal/Alertness: Awake/alert Behavior During Therapy: WFL for tasks assessed/performed Overall Cognitive Status: Within Functional Limits for tasks assessed                                 General Comments: slowed processing      General Comments      Exercises  Assessment/Plan    PT Assessment Patent does not need any further PT services  PT Problem List         PT Treatment Interventions      PT Goals (Current goals can be found in the Care Plan section)  Acute Rehab PT Goals PT Goal Formulation: All assessment and education complete, DC therapy    Frequency     Barriers to discharge        Co-evaluation               AM-PAC PT "6 Clicks" Daily Activity  Outcome Measure Difficulty turning over in bed (including adjusting bedclothes, sheets and blankets)?: None Difficulty moving from lying on back to sitting  on the side of the bed? : None Difficulty sitting down on and standing up from a chair with arms (e.g., wheelchair, bedside commode, etc,.)?: None Help needed moving to and from a bed to chair (including a wheelchair)?: None Help needed walking in hospital room?: None Help needed climbing 3-5 steps with a railing? : A Little 6 Click Score: 23    End of Session   Activity Tolerance: Patient tolerated treatment well Patient left: in chair;with call bell/phone within reach Nurse Communication: Mobility status PT Visit Diagnosis: Difficulty in walking, not elsewhere classified (R26.2)    Time: 8119-1478 PT Time Calculation (min) (ACUTE ONLY): 19 min   Charges:   PT Evaluation $PT Eval Moderate Complexity: 1 Mod     PT G Codes:   PT G-Codes **NOT FOR INPATIENT CLASS** Functional Assessment Tool Used: AM-PAC 6 Clicks Basic Mobility;Clinical judgement Functional Limitation: Mobility: Walking and moving around Mobility: Walking and Moving Around Current Status (G9562): 0 percent impaired, limited or restricted Mobility: Walking and Moving Around Goal Status (Z3086): 0 percent impaired, limited or restricted Mobility: Walking and Moving Around Discharge Status [phone]): 0 percent impaired, limited or restricted    11/30/2016  [place] Bing, PT [phone]  (pager)  Vincent Hogan 11/30/2016, 1:57 PM

## 2016-11-30 NOTE — Progress Notes (Signed)
STROKE TEAM PROGRESS NOTE   HISTORY OF PRESENT ILLNESS (per record) Vincent Hogan is an 51 y.o. male presenting to ED due to family noting his speech was slurred and he did not feel well. On Exam he was non verbal other than whispering, eyes clenched tight, following simple commands but showing minimal effort throughout.  Complaining of HA. BP systolic in the 230.  Initial CT head was negative or any acute process but with a lot of atrophy and evidence of old strokes and chronic white matter disease.  Head CTA and Neck was negative.  MRI was not open for 40 minutes.  Patient was not a tPA candidate due to lack of localizing and lateralizing symptoms.   Date last known well: Date: 11/29/2016 Time last known well: Time: 13:00 tPA Given: No: minimal symptoms   SUBJECTIVE (INTERVAL HISTORY) His RN is at the bedside.  He presented with transient speech difficulties yesterday which resolved shortly after arrival. He does have a history of speech difficulties following medical remote infarcts. He sees Dr. 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   OBJECTIVE Temp:  [97.9 F (36.6 C)-98.5 F (36.9 C)] 98.5 F (36.9 C) (10/03 1454) Pulse Rate:  [57-76] 76 (10/03 1454) Cardiac Rhythm: Heart block (10/03 0703) Resp:  [16-23] 16 (10/03 1454) BP: (142-203)/(80-115) 142/91 (10/03 1454) SpO2:  [97 %-100 %] 97 % (10/03 1454) Weight:  [259 lb 6.4 oz (117.7 kg)] 259 lb 6.4 oz (117.7 kg) (10/03 0537)  CBC:  Recent Labs Lab 11/29/16 1413 11/29/16 1427 11/29/16 2142  WBC 5.8  --  5.5  NEUTROABS 4.6  --   --   HGB 12.6* 14.6 12.2*  HCT 39.5 43.0 36.7*  MCV 77.1*  --  77.3*  PLT 220  --  214    Basic Metabolic Panel:  Recent Labs Lab 11/29/16 1413 11/29/16 1427 11/29/16 2142 11/30/16 0601  NA 135 138  --  135  K 5.1 5.1  --  4.2  CL 102 104  --  104  CO2 24  --   --  22  GLUCOSE 143* 142*  --  98  BUN 21* 26*  --  19  CREATININE 2.00* 2.00* 1.82*  1.69*  CALCIUM 9.6  --   --  9.3    Lipid Panel:    Component Value Date/Time   CHOL 129 11/30/2016 0601   TRIG 109 11/30/2016 0601   HDL 33 (L) 11/30/2016 0601   CHOLHDL 3.9 11/30/2016 0601   VLDL 22 11/30/2016 0601   LDLCALC 74 11/30/2016 0601   HgbA1c:  Lab Results  Component Value Date   HGBA1C 6.3 (H) 11/30/2016   Urine Drug Screen:    Component Value Date/Time   LABOPIA NONE DETECTED 11/29/2016 1856   COCAINSCRNUR NONE DETECTED 11/29/2016 1856   LABBENZ NONE DETECTED 11/29/2016 1856   AMPHETMU NONE DETECTED 11/29/2016 1856   THCU NONE DETECTED 11/29/2016 1856   LABBARB NONE DETECTED 11/29/2016 1856    Alcohol Level     Component Value Date/Time   ETH <10 11/29/2016 1413    IMAGING  Ct Angio Head W Or Wo Hogan Ct Angio Neck W Or Wo Hogan 11/29/2016 IMPRESSION: 1. No emergent large vessel occlusion. 2. Age advanced intracranial atherosclerosis with high-grade left A2 and P2 segment stenoses. Moderate atherosclerotic irregularity of bilateral MCA branches. 3. No flow limiting stenosis in the neck. Strongly left dominant vertebral artery. 4. 5 cm left thyroid mass, recommend sonography when clinically  appropriate. 5. 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 Chest CT could be obtained when clinically appropriate.  Dg Chest 2 View 11/29/2016 IMPRESSION: No active cardiopulmonary disease.  Vincent Hogan 11/29/2016 IMPRESSION: 1. No acute abnormality. 2. Severe chronic ischemic microangiopathy with numerous old centrum semiovale and corona radiata lacunar infarcts. 3. 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  US Renal 11/29/2016 IMPRESSION: Normal size kidneys without hydronephrosis. Small bilateral renal cysts.  Ct Head Code Stroke Wo Hogan 11/29/2016 IMPRESSION: 1. Severe chronic ischemic microangiopathy and age advanced atrophy. No acute abnormality. 2. ASPECTS is 10. These results were called by telephone at the time of interpretation on  11/29/2016 at 2:37 pm to [facility] LAW , who verbally acknowledged these results.    PHYSICAL EXAM Mildly obese middle-aged African-American male currently not in distress. . Afebrile. Head is nontraumatic. Neck is supple without bruit.    Cardiac exam no murmur or gallop. Lungs are clear to auscultation. Distal pulses are well felt. Neurological Exam ;  Awake  Alert oriented x 3. Diminished attention, registration and recall. Normal speech and language.eye movements full without nystagmus.fundi were not visualized. Vision acuity and fields appear normal. Hearing is normal. Palatal movements are normal. Jaw jerk is brisk. Face asymmetric with mild left lower facial weakness.. Tongue midline. Normal strength, tone, reflexes and coordination. Except diminished fine finger movements on the left. Orbits right over left upper extremity. Normal sensation. Gait deferred.   ASSESSMENT/PLAN Vincent Hogan is a 51 y.o. male with history of previous stroke, hypertnsion presenting with slurred speech and altered mental staus. He did not receive IV t-PA due to lack of localizing symptoms.   TIA v Recurrence of old stroke deficits: no acute only numerous old infarcts on imaging   Resultant Speech slurring and alertness improved  CT head No acute stroke  MRI head Severe chronic microangiopathy, old lacunar infarcts  CTA Neck Intracranial atherosclerosis severe in left A2 and P2, moderate in bilateral MCA, no emergent occlusion  Carotid Doppler  CTA neck  2D Echo  pending  LDL 74  HgbA1c 6.3%  Enoxaparin for VTE prophylaxis  Diet Heart Room service appropriate? Yes; Fluid consistency: Thin  Diet - low sodium heart healthy  aspirin 325 mg daily prior to admission, now on aspirin 325 mg daily  Patient counseled to be compliant with his antithrombotic medications  Ongoing aggressive stroke risk factor management  Therapy recommendations:  No PT, pending others  Disposition:   pending  Hypertension        Unstable very high on presentation  Does not need permissive goals with no acute stroke  Long-term BP goal normotensive <130/90  Hyperlipidemia  Home meds:  atorvastain , resumed in hospital  LDL 74, goal < 70  Continue statin at discharge  Diabetes  HgbA1c 6.3%, goal < 7.0  Controlled  Other Stroke Risk Factors  Obesity, Body mass index is 31.58 kg/m., recommend weight loss, diet and exercise as appropriate   Hx stroke/TIA  Other Active Problems  -  Hospital day # 0  I have personally examined this patient, reviewed notes, independently viewed imaging studies, participated in medical decision making and plan of care.ROS completed by me personally and pertinent positives fully documented  I have made any additions or clarifications directly to the above note. He presented with transient speech difficulties which have resolved and imaging studies do not show any acute stroke. He has multiple old lacunar infarcts and extensive changes of chronic small vessel disease and  mild cognitive impairment at baseline. Recommend continue aspirin and aggressive risk factor modification. Discussed with Dr. Jomarie Longs. Greater than 50% time during this 25 minute visit was spent on counseling and coordination of care about his lacunar infarcts, cognitive impairment and answering questions. Follow-up with her neurologist Dr. Hyacinth Meeker as an outpatient.  Delia Heady, MD Medical Director [facility] Stroke Center Pager: [phone] 11/30/2016 4:58 PM   To contact Stroke Continuity provider, please refer to [email or web address]. After hours, contact General Neurology

## 2016-12-01 ENCOUNTER — Observation Stay (HOSPITAL_COMMUNITY): Payer: BC Managed Care – PPO

## 2016-12-01 DIAGNOSIS — N183 Chronic kidney disease, stage 3 (moderate): Secondary | ICD-10-CM | POA: Diagnosis not present

## 2016-12-01 DIAGNOSIS — I6300 Cerebral infarction due to thrombosis of unspecified precerebral artery: Secondary | ICD-10-CM | POA: Diagnosis not present

## 2016-12-01 DIAGNOSIS — G9340 Encephalopathy, unspecified: Secondary | ICD-10-CM | POA: Diagnosis not present

## 2016-12-01 DIAGNOSIS — N179 Acute kidney failure, unspecified: Secondary | ICD-10-CM | POA: Diagnosis not present

## 2016-12-01 LAB — ECHOCARDIOGRAM COMPLETE
AVLVOTPG: 6 mmHg
CHL CUP MV DEC (S): 299
E decel time: 299 msec
EERAT: 8.64
FS: 37 % (ref 28–44)
Height: 76 in
IV/PV OW: 1.08
LA diam end sys: 39 mm
LADIAMINDEX: 1.58 cm/m2
LASIZE: 39 mm
LAVOL: 54.4 mL
LAVOLA4C: 51.3 mL
LAVOLIN: 22 mL/m2
LDCA: 4.52 cm2
LVEEAVG: 8.64
LVEEMED: 8.64
LVELAT: 6.96 cm/s
LVOT VTI: 19.1 cm
LVOT diameter: 24 mm
LVOT peak vel: 119 cm/s
LVOTSV: 86 mL
Lateral S' vel: 22 cm/s
MVPKAVEL: 88.8 m/s
MVPKEVEL: 60.1 m/s
PW: 11.6 mm — AB (ref 0.6–1.1)
RV TAPSE: 19.3 mm
TDI e' lateral: 6.96
TDI e' medial: 5.66
Weight: 4150.4 oz

## 2016-12-01 LAB — CBC
HEMATOCRIT: 37.8 % — AB (ref 39.0–52.0)
HEMOGLOBIN: 12.5 g/dL — AB (ref 13.0–17.0)
MCH: 25.3 pg — AB (ref 26.0–34.0)
MCHC: 33.1 g/dL (ref 30.0–36.0)
MCV: 76.4 fL — ABNORMAL LOW (ref 78.0–100.0)
Platelets: 228 10*3/uL (ref 150–400)
RBC: 4.95 MIL/uL (ref 4.22–5.81)
RDW: 14 % (ref 11.5–15.5)
WBC: 5.4 10*3/uL (ref 4.0–10.5)

## 2016-12-01 LAB — BASIC METABOLIC PANEL
Anion gap: 10 (ref 5–15)
BUN: 20 mg/dL (ref 6–20)
CHLORIDE: 101 mmol/L (ref 101–111)
CO2: 23 mmol/L (ref 22–32)
Calcium: 9.2 mg/dL (ref 8.9–10.3)
Creatinine, Ser: 1.82 mg/dL — ABNORMAL HIGH (ref 0.61–1.24)
GFR calc non Af Amer: 41 mL/min — ABNORMAL LOW (ref >60)
GFR, EST AFRICAN AMERICAN: 48 mL/min — AB (ref >60)
Glucose, Bld: 106 mg/dL — ABNORMAL HIGH (ref 65–99)
POTASSIUM: 3.9 mmol/L (ref 3.5–5.1)
SODIUM: 134 mmol/L — AB (ref 135–145)

## 2016-12-01 MED ORDER — HYOSCYAMINE SULFATE 0.125 MG PO TABS
0.1250 mg | ORAL_TABLET | Freq: Once | ORAL | Status: AC
  Administered 2016-12-01: 0.125 mg via ORAL
  Filled 2016-12-01: qty 1

## 2016-12-01 NOTE — Progress Notes (Signed)
STROKE TEAM PROGRESS NOTE   HISTORY OF PRESENT ILLNESS (per record) Vincent Hogan is an 51 y.o. male presenting to ED due to family noting his speech was slurred and he did not feel well. On Exam he was non verbal other than whispering, eyes clenched tight, following simple commands but showing minimal effort throughout.  Complaining of HA. BP systolic in the 230.  Initial CT head was negative or any acute process but with a lot of atrophy and evidence of old strokes and chronic white matter disease.  Head CTA and Neck was negative.  MRI was not open for 40 minutes.  Patient was not a tPA candidate due to lack of localizing and lateralizing symptoms.   Date last known well: Date: 11/29/2016 Time last known well: Time: 13:00 tPA Given: No: minimal symptoms   SUBJECTIVE (INTERVAL HISTORY) His RN is at the bedside.  He presented with transient speech difficulties yesterday which resolved shortly after arrival. He has no complaints today and wants to go home   OBJECTIVE Temp:  [98.6 F (37 C)-99.2 F (37.3 C)] 98.6 F (37 C) (10/04 0439) Pulse Rate:  [74-81] 81 (10/04 0439) Cardiac Rhythm: Heart block;Normal sinus rhythm (10/04 0700) Resp:  [18] 18 (10/04 0439) BP: (156-168)/(99-102) 156/102 (10/04 0439) SpO2:  [97 %-98 %] 97 % (10/04 0439)  CBC:  Recent Labs Lab 11/29/16 1413  11/29/16 2142 12/01/16 0638  WBC 5.8  --  5.5 5.4  NEUTROABS 4.6  --   --   --   HGB 12.6*  < > 12.2* 12.5*  HCT 39.5  < > 36.7* 37.8*  MCV 77.1*  --  77.3* 76.4*  PLT 220  --  214 228  < > = values in this interval not displayed.  Basic Metabolic Panel:   Recent Labs Lab 11/30/16 0601 12/01/16 0638  NA 135 134*  K 4.2 3.9  CL 104 101  CO2 22 23  GLUCOSE 98 106*  BUN 19 20  CREATININE 1.69* 1.82*  CALCIUM 9.3 9.2    Lipid Panel:     Component Value Date/Time   CHOL 129 11/30/2016 0601   TRIG 109 11/30/2016 0601   HDL 33 (L) 11/30/2016 0601   CHOLHDL 3.9 11/30/2016 0601   VLDL  22 11/30/2016 0601   LDLCALC 74 11/30/2016 0601   HgbA1c:  Lab Results  Component Value Date   HGBA1C 6.3 (H) 11/30/2016   Urine Drug Screen:     Component Value Date/Time   LABOPIA NONE DETECTED 11/29/2016 1856   COCAINSCRNUR NONE DETECTED 11/29/2016 1856   LABBENZ NONE DETECTED 11/29/2016 1856   AMPHETMU NONE DETECTED 11/29/2016 1856   THCU NONE DETECTED 11/29/2016 1856   LABBARB NONE DETECTED 11/29/2016 1856    Alcohol Level     Component Value Date/Time   ETH <10 11/29/2016 1413    IMAGING  Ct Angio Head W Or Wo Contrast Ct Angio Neck W Or Wo Contrast 11/29/2016 IMPRESSION: 1. No emergent large vessel occlusion. 2. Age advanced intracranial atherosclerosis with high-grade left A2 and P2 segment stenoses. Moderate atherosclerotic irregularity of bilateral MCA branches. 3. No flow limiting stenosis in the neck. Strongly left dominant vertebral artery. 4. 5 cm left thyroid mass, recommend sonography when clinically appropriate. 5. 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 Chest CT could be obtained when clinically appropriate.  Dg Chest 2 View 11/29/2016 IMPRESSION: No active cardiopulmonary disease.  [name] 11/29/2016 IMPRESSION: 1. No acute abnormality. 2. Severe chronic  ischemic microangiopathy with numerous old centrum semiovale and corona radiata lacunar infarcts. 3. 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  US Renal 11/29/2016 IMPRESSION: Normal size kidneys without hydronephrosis. Small bilateral renal cysts.  Ct Head Code Stroke Wo Contrast 11/29/2016 IMPRESSION: 1. Severe chronic ischemic microangiopathy and age advanced atrophy. No acute abnormality. 2. ASPECTS is 10. These results were called by telephone at the time of interpretation on 11/29/2016 at 2:37 pm to PA [facility] LAW , who verbally acknowledged these results.    PHYSICAL EXAM Mildly obese middle-aged African-American male currently not in distress. . Afebrile. Head is  nontraumatic. Neck is supple without bruit.    Cardiac exam no murmur or gallop. Lungs are clear to auscultation. Distal pulses are well felt. Neurological Exam ;  Awake  Alert oriented x 3. Diminished attention, registration and recall. Normal speech and language.eye movements full without nystagmus.fundi were not visualized. Vision acuity and fields appear normal. Hearing is normal. Palatal movements are normal. Jaw jerk is brisk. Face asymmetric with mild left lower facial weakness.. Tongue midline. Normal strength, tone, reflexes and coordination. Except diminished fine finger movements on the left. Orbits right over left upper extremity. Normal sensation. Gait deferred.   ASSESSMENT/PLAN [name] is a 51 y.o. male with history of previous stroke, hypertnsion presenting with slurred speech and altered mental staus. He did not receive IV t-PA due to lack of localizing symptoms.   TIA v Recurrence of old stroke deficits: no acute only numerous old infarcts on imaging   Resultant Speech slurring and alertness improved  CT head No acute stroke  MRI head Severe chronic microangiopathy, old lacunar infarcts  CTA Neck Intracranial atherosclerosis severe in left A2 and P2, moderate in bilateral MCA, no emergent occlusion  Carotid Doppler  CTA neck 2D Echo  Left ventricle: The cavity size was normal. There was mild   concentric hypertrophy. Systolic function was vigorous. The   estimated ejection fraction was in the range of 65% to 70%. Wall   motion was normal; there were no regional wall motion    abnormalities  LDL 74  HgbA1c 6.3%  Enoxaparin for VTE prophylaxis Diet Heart Room service appropriate? Yes; Fluid consistency: Thin Diet - low sodium heart healthy  aspirin 325 mg daily prior to admission, now on aspirin 325 mg daily  Patient counseled to be compliant with his antithrombotic medications  Ongoing aggressive stroke risk factor management  Therapy  recommendations:  No PT, pending others  Disposition:  pending  Hypertension        Unstable very high on presentation  Does not need permissive goals with no acute stroke  Long-term BP goal normotensive <130/90  Hyperlipidemia  Home meds:  atorvastain , resumed in hospital  LDL 74, goal < 70  Continue statin at discharge  Diabetes  HgbA1c 6.3%, goal < 7.0  Controlled  Other Stroke Risk Factors  Obesity, Body mass index is 31.58 kg/m., recommend weight loss, diet and exercise as appropriate   Hx stroke/TIA  Other Active Problems  -  Hospital day # 0  I have personally examined this patient, reviewed notes, independently viewed imaging studies, participated in medical decision making and plan of care.ROS completed by me personally and pertinent positives fully documented  I have made any additions or clarifications directly to the above note. He presented with transient speech difficulties which have resolved and imaging studies do not show any acute stroke. He has multiple old lacunar infarcts and extensive changes of chronic small vessel disease and  mild cognitive impairment at baseline. Recommend continue aspirin and aggressive risk factor modification. Discussed with Dr. Jomarie Longs. Greater than 50% time during this 15 minute visit was spent on counseling and coordination of care about his lacunar infarcts, cognitive impairment and answering questions. Follow-up with her neurologist Dr. Hyacinth Meeker as an outpatient.  Delia Heady, MD Medical Director [facility] Stroke Center Pager: [phone] 12/01/2016 4:19 PM   To contact Stroke Continuity provider, please refer to [email or web address]. After hours, contact General Neurology

## 2016-12-01 NOTE — Progress Notes (Signed)
Vincent Hogan to be D/C'd Home per MD order. Discussed with the patient and all questions fully answered.  Allergies as of 12/01/2016      Reactions   Lisinopril    REACTION: cough      Medication List    STOP taking these medications   lisinopril 20 MG tablet Commonly known as:  PRINIVIL,ZESTRIL     TAKE these medications   aspirin 325 MG tablet Take 1 tablet (325 mg total) by mouth daily. With Food   atorvastatin 80 MG tablet Commonly known as:  LIPITOR Take 1 tablet (80 mg total) by mouth daily at 6 PM.   chlorthalidone 25 MG tablet Commonly known as:  HYGROTON Take 25 mg by mouth daily.   clopidogrel 75 MG tablet Commonly known as:  PLAVIX Take 75 mg by mouth daily.   donepezil 5 MG tablet Commonly known as:  ARICEPT Take 5 mg by mouth See admin instructions.  once daily x14 days, then  daily - picked up 9/24   labetalol 200 MG tablet Commonly known as:  NORMODYNE Take 200 mg by mouth 2 (two) times daily.       VVS, Skin clean, dry and intact without evidence of skin break down, no evidence of skin tears noted.  IV catheters discontinued intact. Site without signs and symptoms of complications. Dressings and pressure applied.  An After Visit Summary was printed and given to the patient.  Patient escorted via WC, and D/C home via private auto.  Vincent Hogan  12/01/2016 2:02 PM

## 2016-12-01 NOTE — Progress Notes (Signed)
Post hospital follow up scheduled on 12/08/2016 @ 9am with Dr. Eleanora Neighbor, @ the [facility] clinic/ [facility] [phone]). Gae Gallop RN,BSN,CM

## 2016-12-01 NOTE — Evaluation (Signed)
Occupational Therapy Evaluation Patient Details Name: Vincent Hogan MRN: [id number] DOB: [birth year] Today's Date: 12/01/2016    History of Present Illness Vincent Hogan is a 51 y.o. male with medical history significant of  Recent CVA, hypertension.  Pt not feeling well and family brought the patient to ED, code stroke was called. He underwent MRI of the brain and CT angio of the head and neck, which were negative for new stroke. He was found to be in hypertensive crisis, and in acute renal failure.   Clinical Impression   This 51 y/o M presents with the above. Pt lives alone, and at baseline is independent with ADLs and functional mobility. Pt completed seated and standing ADLs as well as room level mobility with supervision throughout this session, intermittently required increased time/effort for completing sit<>stand transfers. Pt reports feeling comfortable continuing to complete ADLs after return home. Education completed and questions answered throughout. No further OT needs identified at this time. Will sign off.     Follow Up Recommendations  No OT follow up;Supervision - Intermittent    Equipment Recommendations  3 in 1 bedside commode;Other (comment) (bariatric BSC )           Precautions / Restrictions Precautions Precautions:  (mild fall risk ) Restrictions Weight Bearing Restrictions: No      Mobility Bed Mobility               General bed mobility comments: OOB standing in room upon arrival   Transfers Overall transfer level: Modified independent               General transfer comment: supervision for safety     Balance Overall balance assessment: Needs assistance Sitting-balance support: No upper extremity supported Sitting balance-Leahy Scale: Normal     Standing balance support: No upper extremity supported Standing balance-Leahy Scale: Fair                             ADL either performed or assessed with clinical  judgement   ADL Overall ADL's : At baseline                                       General ADL Comments: Pt standing in room upon arrival, completed seated/standing UB/LB ADLs with supervision throughout though does require increased time/effort to complete toilet transfer to/from regular height toilet, educated on benefits of 3:1 over toilet for increased safety/ease of task completion, feel Pt may benefit      Vision Baseline Vision/History: Wears glasses Wears Glasses: At all times Patient Visual Report: No change from baseline Vision Assessment?: No apparent visual deficits Additional Comments: assessed through functional task completion; no apparent deficits                 Pertinent Vitals/Pain Pain Assessment: No/denies pain          Extremity/Trunk Assessment Upper Extremity Assessment Upper Extremity Assessment: Overall WFL for tasks assessed   Lower Extremity Assessment Lower Extremity Assessment: Defer to PT evaluation       Communication Communication Communication: No difficulties;Other (comment) (slow speech )   Cognition Arousal/Alertness: Awake/alert Behavior During Therapy: WFL for tasks assessed/performed Overall Cognitive Status: Within Functional Limits for tasks assessed  General Comments: slowed processing                    Home Living Family/patient expects to be discharged to:: Private residence Living Arrangements: Alone Available Help at Discharge: Family;Available PRN/intermittently (brother, cousin ) Type of Home: Apartment Home Access: Level entry     Home Layout: One level     Bathroom Shower/Tub: Chief Strategy Officer: Standard     Home Equipment: Environmental consultant - 2 wheels;Cane - single point          Prior Functioning/Environment Level of Independence: Independent        Comments: doesn't use AD's, takes public transportation to get groceries,  etc         OT Problem List: Decreased activity tolerance;Decreased knowledge of use of DME or AE            OT Goals(Current goals can be found in the care plan section) Acute Rehab OT Goals Patient Stated Goal: return home  OT Goal Formulation: All assessment and education complete, DC therapy                                 AM-PAC PT "6 Clicks" Daily Activity     Outcome Measure Help from another person eating meals?: None Help from another person taking care of personal grooming?: None Help from another person toileting, which includes using toliet, bedpan, or urinal?: A Little Help from another person bathing (including washing, rinsing, drying)?: A Little Help from another person to put on and taking off regular upper body clothing?: None Help from another person to put on and taking off regular lower body clothing?: None 6 Click Score: 22   End of Session Nurse Communication: Mobility status  Activity Tolerance: Patient tolerated treatment well Patient left: Other (comment) (seated at sink to complete washing up )  OT Visit Diagnosis: Other symptoms and signs involving the nervous system (R29.898);Unsteadiness on feet (R26.81)                Time: 3086-5784 OT Time Calculation (min): 19 min Charges:  OT General Charges $OT Visit: 1 Visit OT Evaluation $OT Eval Low Complexity: 1 Low G-Codes: OT G-codes **NOT FOR INPATIENT CLASS** Functional Assessment Tool Used: AM-PAC 6 Clicks Daily Activity;Clinical judgement Functional Limitation: Self care Self Care Current Status (O9629): At least 1 percent but less than 20 percent impaired, limited or restricted Self Care Goal Status (B2841): At least 1 percent but less than 20 percent impaired, limited or restricted Self Care Discharge Status [phone]): At least 1 percent but less than 20 percent impaired, limited or restricted   Vincent Hogan, OT Pager [phone] 12/01/2016   Vincent Hogan 12/01/2016,  9:32 AM

## 2016-12-01 NOTE — Progress Notes (Signed)
  Echocardiogram 2D Echocardiogram has been performed.  Delcie Roch 12/01/2016, 10:21 AM

## 2016-12-01 NOTE — Progress Notes (Signed)
CM received consult: Runs out of medication at times and has trouble getting to pharmacy to pick them up. CM spoke with pt regarding matter and pt stated he should no longer have a problem with picking up medication from pharmacy. States will help him with matter. Gae Gallop RN,BSN,CM

## 2016-12-02 NOTE — Discharge Summary (Signed)
Physician Discharge Summary  Vincent Hogan WUJ:[id number] DOB: 01-17-1966 DOA: 11/29/2016  PCP: No primary care provider on file.  Admit date: 11/29/2016 Discharge date: 12/01/2016  Time spent: 35 minutes  Recommendations for Outpatient Follow-up:  1. PCP in 1 week 2. Neurology Dr.Miller in [place] in 1 month   Discharge Diagnoses:    TIA     Slurring of speech   AKi on CKD3   Vascular dementia   Uncontrolled HTN   GOUT   History of cardiovascular disorder   Cerebral infarction Vincent Hogan)   Chronic kidney disease   Acute encephalopathy   Discharge Condition: stable  Diet recommendation: heart healthy  Filed Weights   11/29/16 1353 11/30/16 0537  Weight: 108.9 kg (240 lb) 117.7 kg (259 lb 6.4 oz)    History of present illness:  Vincent Hogan a 50 y.o.malewith medical history significant of Recent CVA, hypertension, was not feeling well family noting his speech was slurred and brought the patient to ED, code stroke was called. He underwent MRI of the brain and CT angio of the head and neck, which were not sig for new stroke. He was found to be in hypertensive crisis, and in acute renal failure  Hogan Course:   Slurring of speech/acute encephalopathy -resolved -suspect TIA, h/o prior CVA and MRI with evidence of multiple old Strokes -seen by Neurology, advised to continue ASA  daily -MRI negative for acute CVA -Vincent Hogan without large vessel occlusions -ECHO with normal EF and wall motion -CTA neck : Intracranial atherosclerosis severe in left A2 and P2, moderate in bilateral MCA, no emergent occlusion -LDL74, HgbA1c6.3% -PT/OT/SLP eval completed, no FU recommended  Hypertensive crises -improved -BP stabilized now, pt admits running out of meds few days ago -stopped ACE due to AKI -resumed HCTZ  AKI on CKD3 -baseline creatinine around 1.6, admission creat was 2 -improving back to baseline, stopped ACE  Vascular dementia -mild -continue  aricept    Consultations:  Neurology  Discharge Exam: Vitals:   11/30/16 2210 12/01/16 0439  BP: (!) 168/99 (!) 156/102  Pulse: 74 81  Resp: 18 18  Temp: 99.2 F (37.3 C) 98.6 F (37 C)  SpO2: 98% 97%    General: AAOx3 Cardiovascular: S1S2/RRR Respiratory: CTAB  Discharge Instructions   Discharge Instructions    Diet - low sodium heart healthy    Complete by:  As directed    Increase activity slowly    Complete by:  As directed      Discharge Medication List as of 12/01/2016  1:35 PM    CONTINUE these medications which have NOT CHANGED   Details  aspirin 325 MG tablet Take 1 tablet (325 mg total) by mouth daily. With Food, Starting Fri 10/16/2015, Normal    atorvastatin (LIPITOR) 80 MG tablet Take 1 tablet (80 mg total) by mouth daily at 6 PM., Starting Fri 10/16/2015, Print    chlorthalidone (HYGROTON) 25 MG tablet Take 25 mg by mouth daily., Historical Med    clopidogrel (PLAVIX) 75 MG tablet Take 75 mg by mouth daily., Historical Med    donepezil (ARICEPT) 5 MG tablet Take 5 mg by mouth See admin instructions.  once daily x14 days, then  daily - picked up 9/24, Historical Med    labetalol (NORMODYNE) 200 MG tablet Take 200 mg by mouth 2 (two) times daily., Historical Med      STOP taking these medications     lisinopril (PRINIVIL,ZESTRIL) 20 MG tablet        Allergies  Allergen Reactions  . Lisinopril     REACTION: cough   Follow-up Information    PCP. Schedule an appointment as soon as possible for a visit in 1 week(s).        Vincent Riley, MD. Schedule an appointment as soon as possible for a visit in 1 month(s).   Specialties:  Neurology, Radiology Contact information: [address] [place] [address] [phone]        Health, [facility]. Go on 12/08/2016.   Why:  Post Hogan follow up scheduled on 12/08/2016 at 9am with Dr. Eleanora Hogan, at the [facility] clinic Contact information: [facility]  [address] [place] [address] [phone]            The results of significant diagnostics from this hospitalization (including imaging, microbiology, ancillary and laboratory) are listed below for reference.    Significant Diagnostic Studies: Ct Angio Head W Or Wo Contrast  Result Date: 11/29/2016 CLINICAL DATA:  Difficulty with speech. Hypertension. Follow-up code stroke. EXAM: CT ANGIOGRAPHY HEAD AND NECK TECHNIQUE: Multidetector CT imaging of the head and neck was performed using the standard protocol during bolus administration of intravenous contrast. Multiplanar CT image reconstructions and MIPs were obtained to evaluate the vascular anatomy. Carotid stenosis measurements (when applicable) are obtained utilizing NASCET criteria, using the distal internal carotid diameter as the denominator. CONTRAST:  50 cc Isovue 370 intravenous COMPARISON:  Brain MRI 10/13/2015 FINDINGS: CTA NECK FINDINGS Aortic arch: No acute finding. Three vessel branching. The prominent diameter of the descending aorta, measurement limited by incomplete coverage. The descending aorta measures at least 3.8 cm on coronal reformats. Right carotid system: Vessels are smooth and widely patent. No noted atheromatous changes. Left carotid system: Vessels are smooth and widely patent. No noted atheromatous changes. Vertebral arteries: No proximal subclavian stenosis. Strongly dominant left vertebral artery. The diminutive right vertebral artery is faintly seen to the dura. Skeleton: No acute or aggressive finding. Other neck: Left thyroid mass measuring at least 5 cm, with tracheal deviation but no visible invasion. Upper chest: Negative Review of the MIP images confirms the above findings CTA HEAD FINDINGS Anterior circulation: No emergent large vessel occlusion. High-grade narrowing at the left proximal A2 segment. Moderate atheromatous irregularity of bilateral MCA branches. No beaded appearance. Negative for aneurysm.  Posterior circulation: Strongly left dominant vertebral artery. Right vertebral flow is into a diminutive right pica. Undulating basilar with mild stenosis, likely atheromatous. Moderate to advanced narrowing of the left P2 segment. Negative for aneurysm Venous sinuses: As permitted by contrast timing, patent. Anatomic variants: None significant. Delayed phase: Not performed in the emergent setting. Review of the MIP images confirms the above findings IMPRESSION: 1. No emergent large vessel occlusion. 2. Age advanced intracranial atherosclerosis with high-grade left A2 and P2 segment stenoses. Moderate atherosclerotic irregularity of bilateral MCA branches. 3. No flow limiting stenosis in the neck. Strongly left dominant vertebral artery. 4. 5 cm left thyroid mass, recommend sonography when clinically appropriate. 5. 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 Chest CT could be obtained when clinically appropriate. Electronically Signed   By: Marnee Spring M.D.   On: 11/29/2016 15:03   Dg Chest 2 View  Result Date: 11/29/2016 CLINICAL DATA:  Altered mental status x1 day EXAM: CHEST  2 VIEW COMPARISON:  10/13/2015 FINDINGS: No acute consolidation or pleural effusion. Borderline cardiomegaly. No pneumothorax. IMPRESSION: No active cardiopulmonary disease. Electronically Signed   By: Jasmine Pang M.D.   On: 11/29/2016 22:29  Ct Angio Neck W Or Wo Contrast  Result Date: 11/29/2016 CLINICAL DATA:  Difficulty with speech. Hypertension. Follow-up code stroke. EXAM: CT ANGIOGRAPHY HEAD AND NECK TECHNIQUE: Multidetector CT imaging of the head and neck was performed using the standard protocol during bolus administration of intravenous contrast. Multiplanar CT image reconstructions and MIPs were obtained to evaluate the vascular anatomy. Carotid stenosis measurements (when applicable) are obtained utilizing NASCET criteria, using the distal internal carotid diameter as the denominator.  CONTRAST:  50 cc Isovue 370 intravenous COMPARISON:  Brain MRI 10/13/2015 FINDINGS: CTA NECK FINDINGS Aortic arch: No acute finding. Three vessel branching. The prominent diameter of the descending aorta, measurement limited by incomplete coverage. The descending aorta measures at least 3.8 cm on coronal reformats. Right carotid system: Vessels are smooth and widely patent. No noted atheromatous changes. Left carotid system: Vessels are smooth and widely patent. No noted atheromatous changes. Vertebral arteries: No proximal subclavian stenosis. Strongly dominant left vertebral artery. The diminutive right vertebral artery is faintly seen to the dura. Skeleton: No acute or aggressive finding. Other neck: Left thyroid mass measuring at least 5 cm, with tracheal deviation but no visible invasion. Upper chest: Negative Review of the MIP images confirms the above findings CTA HEAD FINDINGS Anterior circulation: No emergent large vessel occlusion. High-grade narrowing at the left proximal A2 segment. Moderate atheromatous irregularity of bilateral MCA branches. No beaded appearance. Negative for aneurysm. Posterior circulation: Strongly left dominant vertebral artery. Right vertebral flow is into a diminutive right pica. Undulating basilar with mild stenosis, likely atheromatous. Moderate to advanced narrowing of the left P2 segment. Negative for aneurysm Venous sinuses: As permitted by contrast timing, patent. Anatomic variants: None significant. Delayed phase: Not performed in the emergent setting. Review of the MIP images confirms the above findings IMPRESSION: 1. No emergent large vessel occlusion. 2. Age advanced intracranial atherosclerosis with high-grade left A2 and P2 segment stenoses. Moderate atherosclerotic irregularity of bilateral MCA branches. 3. No flow limiting stenosis in the neck. Strongly left dominant vertebral artery. 4. 5 cm left thyroid mass, recommend sonography when clinically appropriate. 5.  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 Chest CT could be obtained when clinically appropriate. Electronically Signed   By: Marnee Spring M.D.   On: 11/29/2016 15:03   [name]  Result Date: 11/29/2016 CLINICAL DATA:  Slurred speech.  Headache.  Hypertension. EXAM: MRI HEAD WITHOUT CONTRAST TECHNIQUE: Multiplanar, multiecho pulse sequences of the brain and surrounding structures were obtained without intravenous contrast. COMPARISON:  CTA head 11/29/2016 FINDINGS: Brain: The midline structures are normal. There is no focal diffusion restriction to indicate acute infarct. Multiple old corona radiata and centrum semiovale lacunar infarcts. There is diffuse confluent hyperintense T2-weighted signal within the periventricular, juxtacortical and deep white matter, most often seen in the setting of chronic microvascular ischemia. Scattered foci chronic microhemorrhage in a nonspecific distribution. Volume loss is advanced for age. Brain volume is normal for age without lobar predominant atrophy. The dura is normal and there is no extra-axial collection. Vascular: Major intracranial arterial and venous sinus flow voids are preserved. Skull and upper cervical spine: The visualized skull base, calvarium, upper cervical spine and extracranial soft tissues are normal. Sinuses/Orbits: No fluid levels or advanced mucosal thickening. No mastoid or middle ear effusion. Normal orbits. IMPRESSION: 1. No acute abnormality. 2. Severe chronic ischemic microangiopathy with numerous old centrum semiovale and corona radiata lacunar infarcts. 3. 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 Electronically Signed   By: Chrisandra Netters.D.  On: 11/29/2016 17:10   US Renal  Result Date: 11/29/2016 CLINICAL DATA:  Acute renal insufficiency. Hypertensive crisis and weakness. EXAM: RENAL / URINARY TRACT ULTRASOUND COMPLETE COMPARISON:  10/13/2015 FINDINGS: Right Kidney: Length: 11.6 cm. Echogenicity within normal  limits. Small cyst over the mid and upper pole with the larger over the mid pole measuring 1.6 cm. No mass or hydronephrosis visualized. Left Kidney: Length: 11.0 cm. Echogenicity within normal limits. 2 cm cyst over the mid pole. No mass or hydronephrosis visualized. Bladder: Appears normal for degree of bladder distention. IMPRESSION: Normal size kidneys without hydronephrosis. Small bilateral renal cysts. Electronically Signed   By: Elberta Fortis M.D.   On: 11/29/2016 22:50   Ct Head Code Stroke Wo Contrast  Result Date: 11/29/2016 CLINICAL DATA:  Code stroke.  Speech difficulties.  Hypertension. EXAM: CT HEAD WITHOUT CONTRAST TECHNIQUE: Contiguous axial images were obtained from the base of the skull through the vertex without intravenous contrast. COMPARISON:  Brain MRI 10/13/2015 FINDINGS: Brain: No mass lesion or acute hemorrhage. No focal hypoattenuation of the basal ganglia or cortex to indicate infarcted tissue. No hydrocephalus or age advanced atrophy. There is periventricular hypoattenuation compatible with chronic microvascular disease. Advanced atrophy for age. Vascular: No hyperdense vessel. No advanced atherosclerotic calcification of the arteries at the skull base. Skull: Normal visualized skull base, calvarium and extracranial soft tissues. Sinuses/Orbits: No sinus fluid levels or advanced mucosal thickening. No mastoid effusion. Normal orbits. ASPECTS [facility] Stroke Program Early CT Score) - Ganglionic level infarction (caudate, lentiform nuclei, internal capsule, insula, M1-M3 cortex): 7 - Supraganglionic infarction (M4-M6 cortex): 3 Total score (0-10 with 10 being normal): 10 IMPRESSION: 1. Severe chronic ischemic microangiopathy and age advanced atrophy. No acute abnormality. 2. ASPECTS is 10. These results were called by telephone at the time of interpretation on 11/29/2016 at 2:37 pm to [place] [facility] [employer] , who verbally acknowledged these results. Electronically Signed   By: Deatra Robinson  M.D.   On: 11/29/2016 14:38    Microbiology: No results found for this or any previous visit (from the past 240 hour(s)).   Labs: Basic Metabolic Panel:  Recent Labs Lab 11/29/16 1413 11/29/16 1427 11/29/16 2142 11/30/16 0601 12/01/16 0638  NA 135 138  --  135 134*  K 5.1 5.1  --  4.2 3.9  CL 102 104  --  104 101  CO2 24  --   --  22 23  GLUCOSE 143* 142*  --  98 106*  BUN 21* 26*  --  19 20  CREATININE 2.00* 2.00* 1.82* 1.69* 1.82*  CALCIUM 9.6  --   --  9.3 9.2   Liver Function Tests:  Recent Labs Lab 11/29/16 1413  AST 31  ALT 28  ALKPHOS 97  BILITOT 0.9  PROT 8.8*  ALBUMIN 3.9   No results for input(s): LIPASE, AMYLASE in the last 168 hours. No results for input(s): AMMONIA in the last 168 hours. CBC:  Recent Labs Lab 11/29/16 1413 11/29/16 1427 11/29/16 2142 12/01/16 0638  WBC 5.8  --  5.5 5.4  NEUTROABS 4.6  --   --   --   HGB 12.6* 14.6 12.2* 12.5*  HCT 39.5 43.0 36.7* 37.8*  MCV 77.1*  --  77.3* 76.4*  PLT 220  --  214 228   Cardiac Enzymes: No results for input(s): CKTOTAL, CKMB, CKMBINDEX, TROPONINI in the last 168 hours. BNP: BNP (last 3 results) No results for input(s): BNP in the last 8760 hours.  ProBNP (last 3  results) No results for input(s): PROBNP in the last 8760 hours.  CBG: No results for input(s): GLUCAP in the last 168 hours.     SignedZannie Cove MD.  [facility] 12/02/2016, 4:10 PM

## 2017-11-21 ENCOUNTER — Encounter: Payer: Self-pay | Admitting: "Endocrinology

## 2018-04-25 ENCOUNTER — Encounter: Payer: Self-pay | Admitting: "Endocrinology

## 2018-04-25 ENCOUNTER — Ambulatory Visit (INDEPENDENT_AMBULATORY_CARE_PROVIDER_SITE_OTHER): Payer: PRIVATE HEALTH INSURANCE | Admitting: "Endocrinology

## 2018-04-25 VITALS — BP 157/91 | HR 57 | Ht 77.0 in | Wt 259.0 lb

## 2018-04-25 DIAGNOSIS — E042 Nontoxic multinodular goiter: Secondary | ICD-10-CM

## 2018-04-25 NOTE — Progress Notes (Signed)
Endocrinology Consult Note                                            04/25/2018, 12:00 PM   Subjective:    Patient ID: Vincent Hogan, male    DOB: 1965/06/08, PCP Betti Cruz, PA-C   Past Medical History:  Diagnosis Date  . Hypertension   . TIA (transient ischemic attack)    Past Surgical History:  Procedure Laterality Date  . TEE WITHOUT CARDIOVERSION N/A 10/16/2015   Procedure: TRANSESOPHAGEAL ECHOCARDIOGRAM (TEE);  Surgeon: Jake Bathe, MD;  Location: [facility] ENDOSCOPY;  Service: Cardiovascular;  Laterality: N/A;  Patient is also getting a loop   Social History   Socioeconomic History  . Marital status: Single    Spouse name: Not on file  . Number of children: Not on file  . Years of education: Not on file  . Highest education level: Not on file  Occupational History  . Not on file  Social Needs  . Financial resource strain: Not on file  . Food insecurity:    Worry: Not on file    Inability: Not on file  . Transportation needs:    Medical: Not on file    Non-medical: Not on file  Tobacco Use  . Smoking status: Never Smoker  . Smokeless tobacco: Never Used  Substance and Sexual Activity  . Alcohol use: No  . Drug use: No  . Sexual activity: Not on file  Lifestyle  . Physical activity:    Days per week: Not on file    Minutes per session: Not on file  . Stress: Not on file  Relationships  . Social connections:    Talks on phone: Not on file    Gets together: Not on file    Attends religious service: Not on file    Active member of club or organization: Not on file    Attends meetings of clubs or organizations: Not on file    Relationship status: Not on file  Other Topics Concern  . Not on file  Social History Narrative  . Not on file   Outpatient Encounter Medications as of 04/25/2018  Medication Sig  . allopurinol (ZYLOPRIM) 100 MG tablet Take 100 mg by mouth daily.  Marland Kitchen amLODipine (NORVASC) 10 MG tablet Take 10 mg by mouth daily.  Marland Kitchen  aspirin EC 81 MG tablet Take 81 mg by mouth daily.  Marland Kitchen atorvastatin (LIPITOR) 80 MG tablet Take 1 tablet (80 mg total) by mouth daily at 6 PM.  . busPIRone (BUSPAR) 5 MG tablet Take 5 mg by mouth 3 (three) times daily.  . clopidogrel (PLAVIX) 75 MG tablet Take 75 mg by mouth daily.  Marland Kitchen donepezil (ARICEPT) 10 MG tablet Take 10 mg by mouth at bedtime.  Marland Kitchen FLUoxetine (PROZAC) 20 MG tablet Take 20 mg by mouth daily.  Marland Kitchen losartan (COZAAR) 100 MG tablet Take 100 mg by mouth daily.  . metFORMIN (GLUCOPHAGE) 500 MG tablet Take by mouth 2 (two) times daily with a meal.  . metoprolol tartrate (LOPRESSOR) 25 MG tablet Take 25 mg by mouth 2 (two) times daily.  . [DISCONTINUED] aspirin 325 MG tablet Take 1 tablet (325 mg total) by mouth daily. With Food  . [DISCONTINUED] chlorthalidone (HYGROTON) 25 MG tablet Take 25 mg by mouth daily.  . [DISCONTINUED] donepezil (ARICEPT) 5  MG tablet Take 5 mg by mouth See admin instructions. 5mg  once daily x14 days, then 10mg  daily - picked up 9/24  . [DISCONTINUED] labetalol (NORMODYNE) 200 MG tablet Take 200 mg by mouth 2 (two) times daily.   No facility-administered encounter medications on file as of 04/25/2018.    ALLERGIES: Allergies  Allergen Reactions  . Lisinopril     REACTION: cough    VACCINATION STATUS:  There is no immunization history on file for this patient.  HPI Vincent Hogan is 53 y.o. male who presents today with a medical history as above. he is being seen in consultation for multinodular goiter requested by Betti Cruz, PA-C. -He denies prior history of thyroid dysfunction, however due to his complaint of cough, intermittent choking, he underwent thyroid ultrasound on November 21, 2017 which revealed large multinodular goiter, measurement details as follows. Right lobe of the thyroid measures 6.3 cm with thyroid nodules, left lobe measures 11.2 cm with several nodules as well. -Chest x-ray on March 01, 2018 revealed tracheal deviation to  the right.  Patient continues to have compressive symptoms including intermittent dysphagia.  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 -He denies any prior exposure to neck radiation.  He is not a smoker.  He has controlled diabetes on metformin therapy. -He does not have recent thyroid function test, denies palpitations, tremors, heat/cold intolerance.   Review of Systems  Constitutional:  + Progressive weight gain, no fatigue, no subjective hyperthermia, no subjective hypothermia Eyes: no blurry vision, no xerophthalmia ENT: no sore throat, no nodules palpated in throat, + dysphagia, +odynophagia, no hoarseness Cardiovascular: no Chest Pain, no Shortness of Breath, no palpitations, no leg swelling Respiratory: + cough, no shortness of breath Gastrointestinal: no Nausea/Vomiting/Diarhhea Musculoskeletal: no muscle/joint aches, + walks with a walker related to his prior CVA. Skin: no rashes Neurological: no tremors, no numbness, no tingling, no dizziness Psychiatric: no depression, no anxiety  Objective:    BP (!) 157/91   Pulse (!) 57   Ht 6\' 5"  (1.956 m)   Wt 259 lb (117.5 kg)   BMI 30.71 kg/m   Wt Readings from Last 3 Encounters:  04/25/18 259 lb (117.5 kg)  11/30/16 259 lb 6.4 oz (117.7 kg)  10/13/15 236 lb 8 oz (107.3 kg)    Physical Exam  Constitutional:  + obese for height, not in acute distress, normal state of mind Eyes: PERRLA, EOMI, no exophthalmos ENT: moist mucous membranes, + gross nodular thyromegaly, no gross cervical lymphadenopathy Cardiovascular: normal precordial activity, Regular Rate and Rhythm, no Murmur/Rubs/Gallops Respiratory:  adequate breathing efforts, no gross chest deformity, Clear to auscultation bilaterally Gastrointestinal: abdomen soft, Non -tender, No distension, Bowel Sounds present, no gross organomegaly Musculoskeletal:  + Weak grip on left upper extremity  Skin: moist, warm, no  rashes Neurological: no tremor with outstretched hands, Deep tendon reflexes normal in bilateral lower extremities.  CMP ( most recent) CMP     Component Value Date/Time   NA 134 (L) 12/01/2016 0638   K 3.9 12/01/2016 0638   CL 101 12/01/2016 0638   CO2 23 12/01/2016 0638   GLUCOSE 106 (H) 12/01/2016 0638   BUN 20 12/01/2016 0638   CREATININE 1.82 (H) 12/01/2016 0638   CALCIUM 9.2 12/01/2016 0638   PROT 8.8 (H) 11/29/2016 1413   ALBUMIN 3.9 11/29/2016 1413   AST 31 11/29/2016 1413   ALT 28 11/29/2016 1413   ALKPHOS 97 11/29/2016 1413  BILITOT 0.9 11/29/2016 1413   GFRNONAA 41 (L) 12/01/2016 0638   GFRAA 48 (L) 12/01/2016 0638     Diabetic Labs (most recent): Lab Results  Component Value Date   HGBA1C 6.3 (H) 11/30/2016   HGBA1C 5.8 (H) 10/13/2015     Lipid Panel ( most recent) Lipid Panel     Component Value Date/Time   CHOL 129 11/30/2016 0601   TRIG 109 11/30/2016 0601   HDL 33 (L) 11/30/2016 0601   CHOLHDL 3.9 11/30/2016 0601   VLDL 22 11/30/2016 0601   LDLCALC 74 11/30/2016 0601      Lab Results  Component Value Date   TSH 0.95 12/08/2006    November 21, 2017 thyroid ultrasound: Done at an outside facility Right lobe measures 6.3 cm with 2 nodules 2.3 cm and 1.5 cm; left lobe measures 11.2 cm with 2 nodules 5.0 cm and 1.8 cm. Radiology impression has been concerning bilateral thyroid nodules FNA biopsy is recommended.   Assessment & Plan:   1. Multinodular goiter  - Vincent Hogan  is being seen at a kind request of Betti Cruz, PA-C. - I have reviewed his available thyroid records and clinically evaluated the patient. - Based on these reviews, he has large asymmetric, compressive multinodular goiter with suspicious nodules. -Options of approach were discussed with the patient including fine-needle aspiration versus direct simple thyroidectomy.  He agrees with my suggestion that simple thyroidectomy would be better given his relative youth  and compressive symptoms.  He understands the subsequent need for thyroid hormone replacement.  If occult malignancy is discovered, he will be considered for additional treatment after thyroidectomy. -I will arrange for him to consult with Dr. Franky Macho and Dr. Henreitta Leber in [place] .  - I did not initiate any new prescriptions today. - I advised him  to maintain close follow up with Betti Cruz, PA-C for primary care needs.   - Time spent with the patient: 35 minutes, of which >50% was spent in obtaining information about his symptoms, reviewing his previous labs/ imaging studies,  evaluations, and treatments, counseling him about his large multinodular goiter, and developing a plan to confirm the diagnosis and long term treatment based on the latest standards of care/guidelines.    Vincent Hogan participated in the discussions, expressed understanding, and voiced agreement with the above plans.  All questions were answered to his satisfaction. he is encouraged to contact clinic should he have any questions or concerns prior to his return visit.  Follow up plan: Return in about 5 weeks (around 05/30/2018) for Follow up with Labs after Surgery.   Marquis Lunch, MD [facility] [address] [place], [place] [address] Phone: [phone]  Fax: [phone]     04/25/2018, 12:00 PM  This note was partially dictated with voice recognition software. Similar sounding words can be transcribed inadequately or may not  be corrected upon review.

## 2018-05-30 ENCOUNTER — Ambulatory Visit: Payer: PRIVATE HEALTH INSURANCE | Admitting: "Endocrinology

## 2018-11-22 IMAGING — MR MR HEAD W/O CM
9 of 10 series · 35 of 48 positions shown · non-contrast
Comparison: CTA head 11/29/2016

CLINICAL DATA: Slurred speech.  Headache.  Hypertension.

EXAM:
MRI HEAD WITHOUT CONTRAST
TECHNIQUE: Multiplanar, multiecho pulse sequences of the brain and surrounding
structures were obtained without intravenous contrast.

[Series 3: DWI · axial · 3.0mm · 1.09mm/px · z∈[-71,+66]mm · 8 of 94 slices shown (1 of 4)]
[im 1/94]
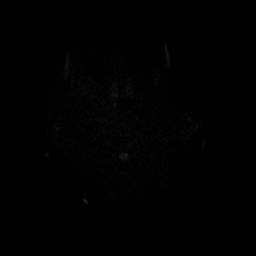
[im 14/94]
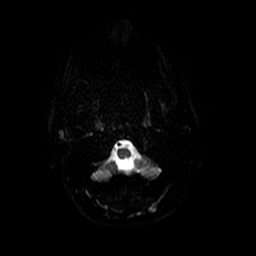
[im 27/94]
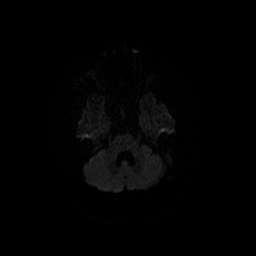
[im 40/94]
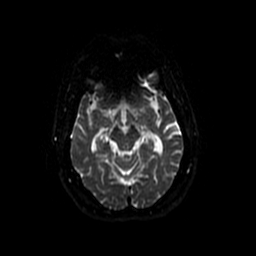
[im 54/94]
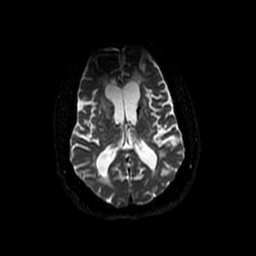
[im 67/94]
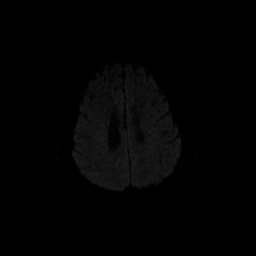
[im 80/94]
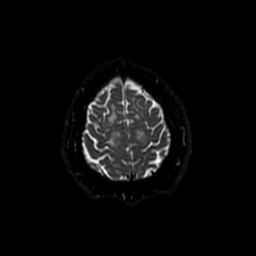
[im 94/94]
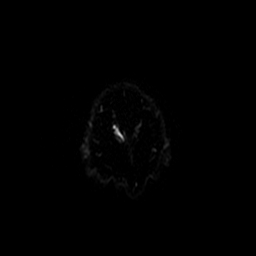

[Series 4: DWI · coronal · 5.0mm · 1.09mm/px · 7 of 66 slices shown (2 of 4)]
[im 1/66]
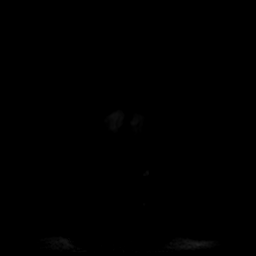
[im 11/66]
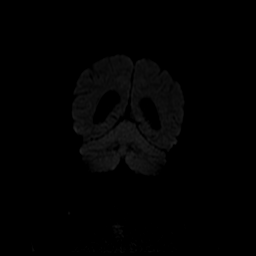
[im 22/66]
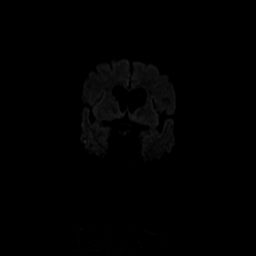
[im 33/66]
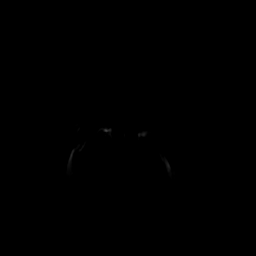
[im 44/66]
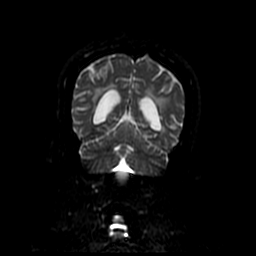
[im 55/66]
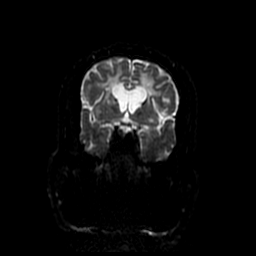
[im 66/66]
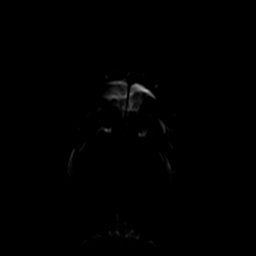

[Series 5: T1 · sagittal · 5.0mm · 0.47mm/px · 2 of 23 slices shown]
[im 1/23]
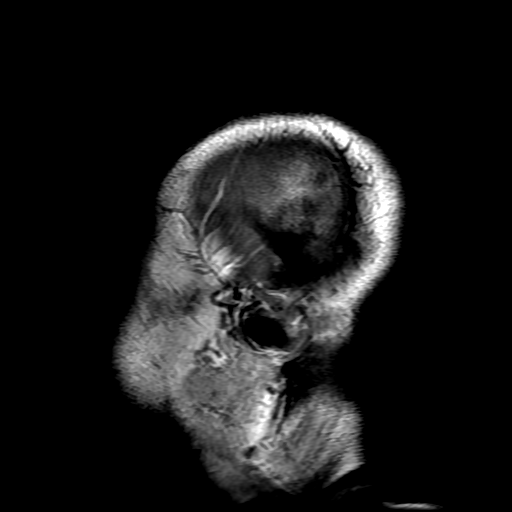
[im 23/23]
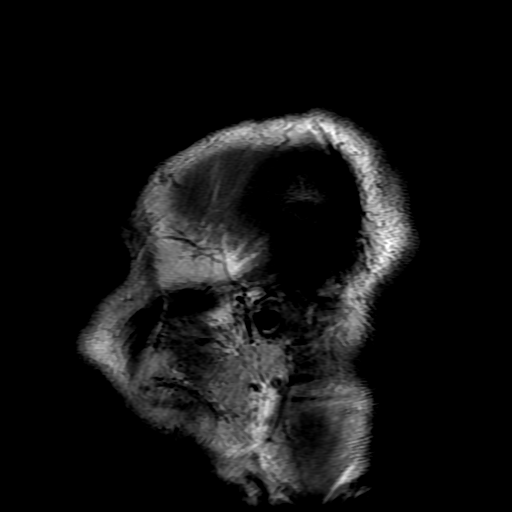

[Series 6: T2 · axial · 5.0mm · 0.43mm/px · z∈[-93,+63]mm · 3 of 27 slices shown (1 of 2)]
[im 1/27]
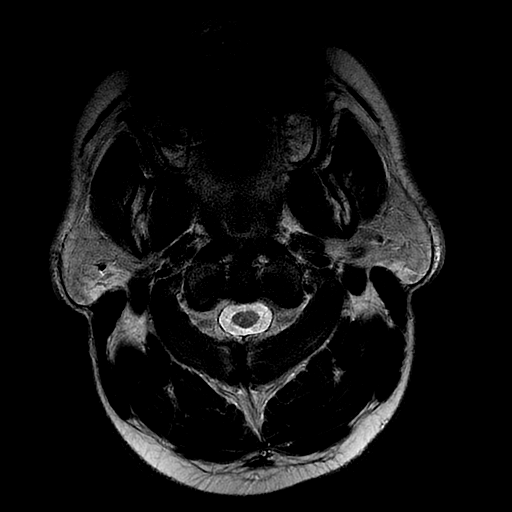
[im 14/27]
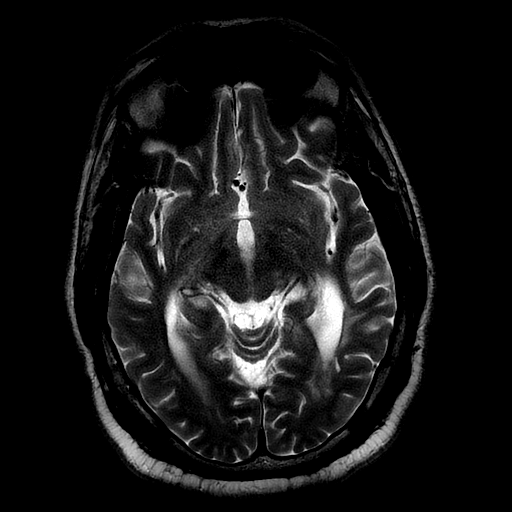
[im 27/27]
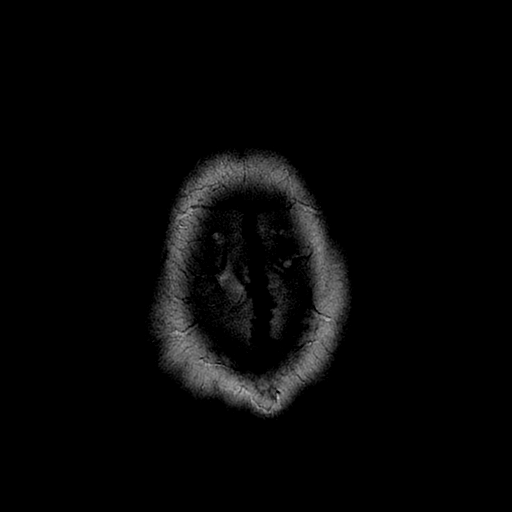

[Series 7: FLAIR · axial · 5.0mm · 0.43mm/px · z∈[-93,+63]mm · 3 of 27 slices shown]
[im 1/27]
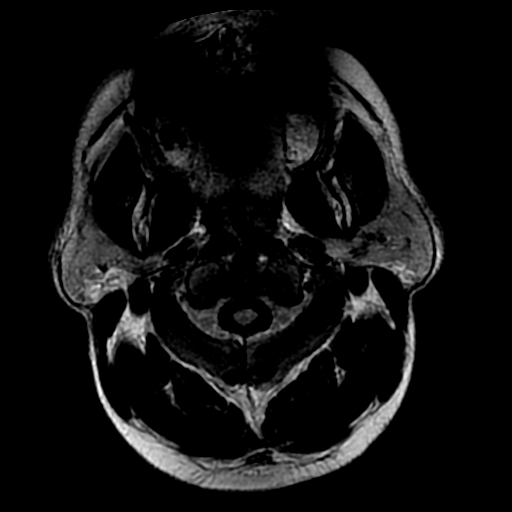
[im 14/27]
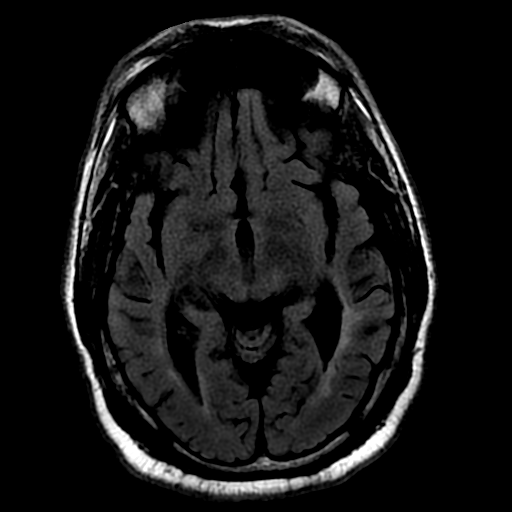
[im 27/27]
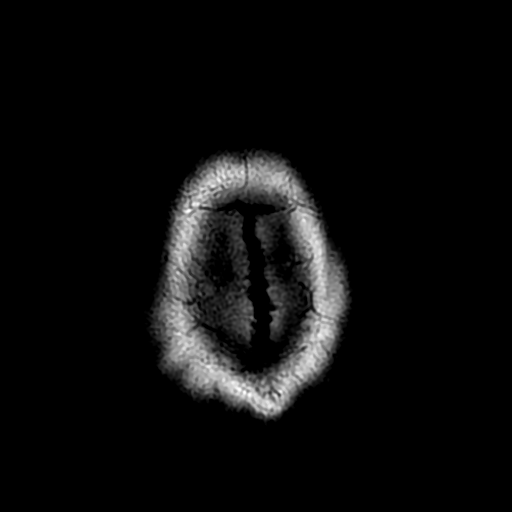

[Series 8: ax mpgr · axial · 5.0mm · 0.43mm/px · 1 of 27 slices shown]
[im 1/27]
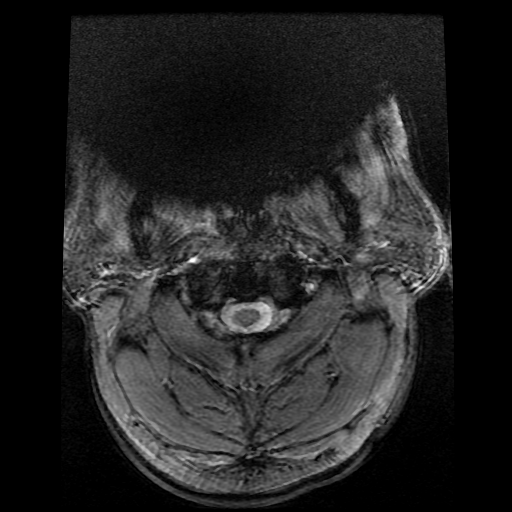

[Series 10: T2 · coronal · 5.0mm · 0.43mm/px · 3 of 28 slices shown (2 of 2)]
[im 1/28]
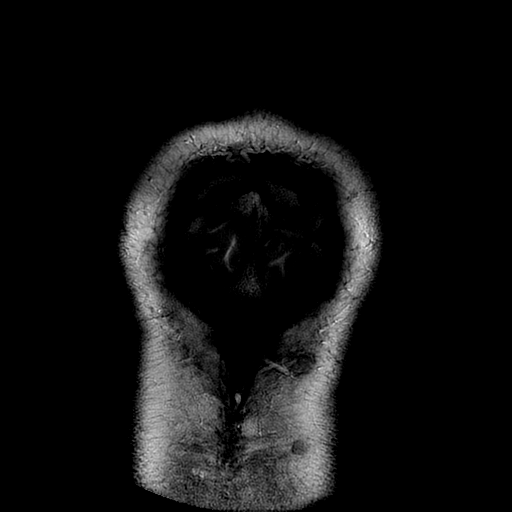
[im 14/28]
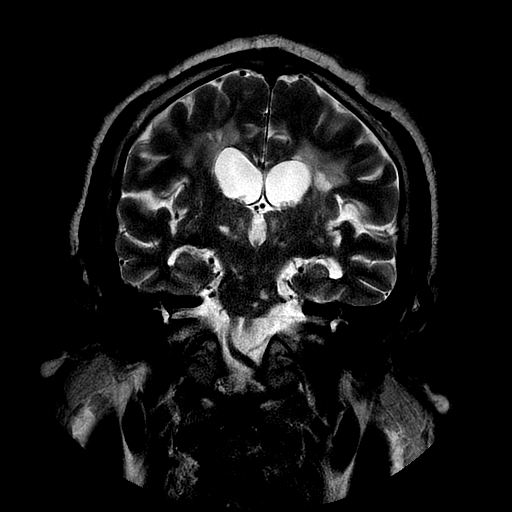
[im 28/28]
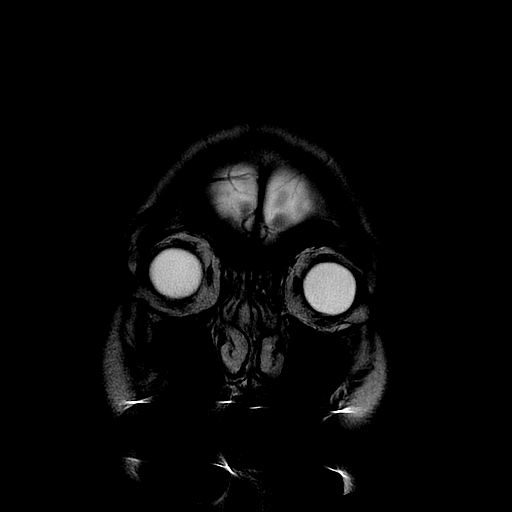

[Series 300: DWI · axial · 3.0mm · 1.09mm/px · z∈[-71,+66]mm · 5 of 47 slices shown (3 of 4)]
[im 1/47]
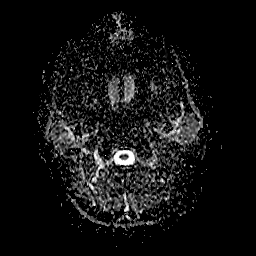
[im 12/47]
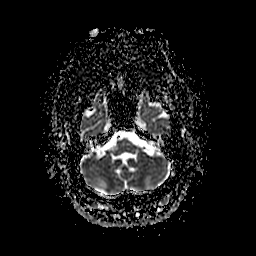
[im 24/47]
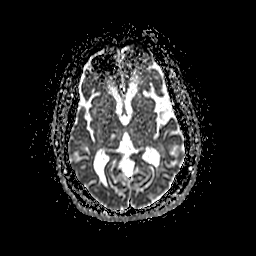
[im 35/47]
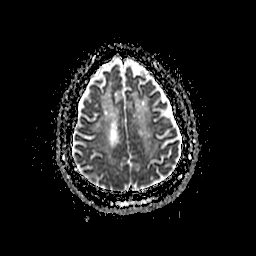
[im 47/47]
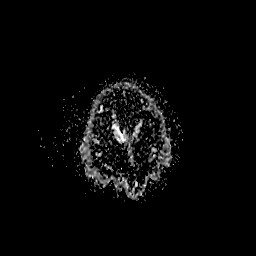

[Series 400: DWI · coronal · 5.0mm · 1.09mm/px · 3 of 33 slices shown (4 of 4)]
[im 1/33]
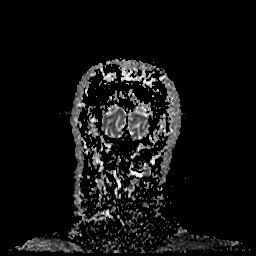
[im 17/33]
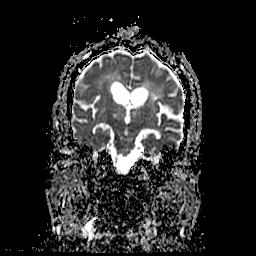
[im 33/33]
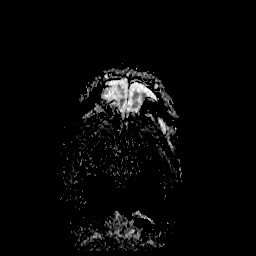

[35 of 48 positions shown; findings below may reference images not displayed]

FINDINGS: Brain: The midline structures are normal. There is no focal
diffusion restriction to indicate acute infarct. Multiple old corona
radiata and centrum semiovale lacunar infarcts. There is diffuse
confluent hyperintense T2-weighted signal within the
periventricular, juxtacortical and deep white matter, most often
seen in the setting of chronic microvascular ischemia. Scattered
foci chronic microhemorrhage in a nonspecific distribution. Volume
loss is advanced for age. Brain volume is normal for age without
lobar predominant atrophy. The dura is normal and there is no
extra-axial collection.

Vascular: Major intracranial arterial and venous sinus flow voids
are preserved.

Skull and upper cervical spine: The visualized skull base,
calvarium, upper cervical spine and extracranial soft tissues are
normal.

Sinuses/Orbits: No fluid levels or advanced mucosal thickening. No
mastoid or middle ear effusion. Normal orbits.
IMPRESSION: 1. No acute abnormality.
2. Severe chronic ischemic microangiopathy with numerous old centrum
semiovale and corona radiata lacunar infarcts.
3. Age advanced atrophy.

## 2018-11-22 IMAGING — CR DG CHEST 2V
2 series · 2 of 2 positions shown · non-contrast
Comparison: 10/13/2015

CLINICAL DATA: Altered mental status x1 day

EXAM:
CHEST  2 VIEW

[chest lat]
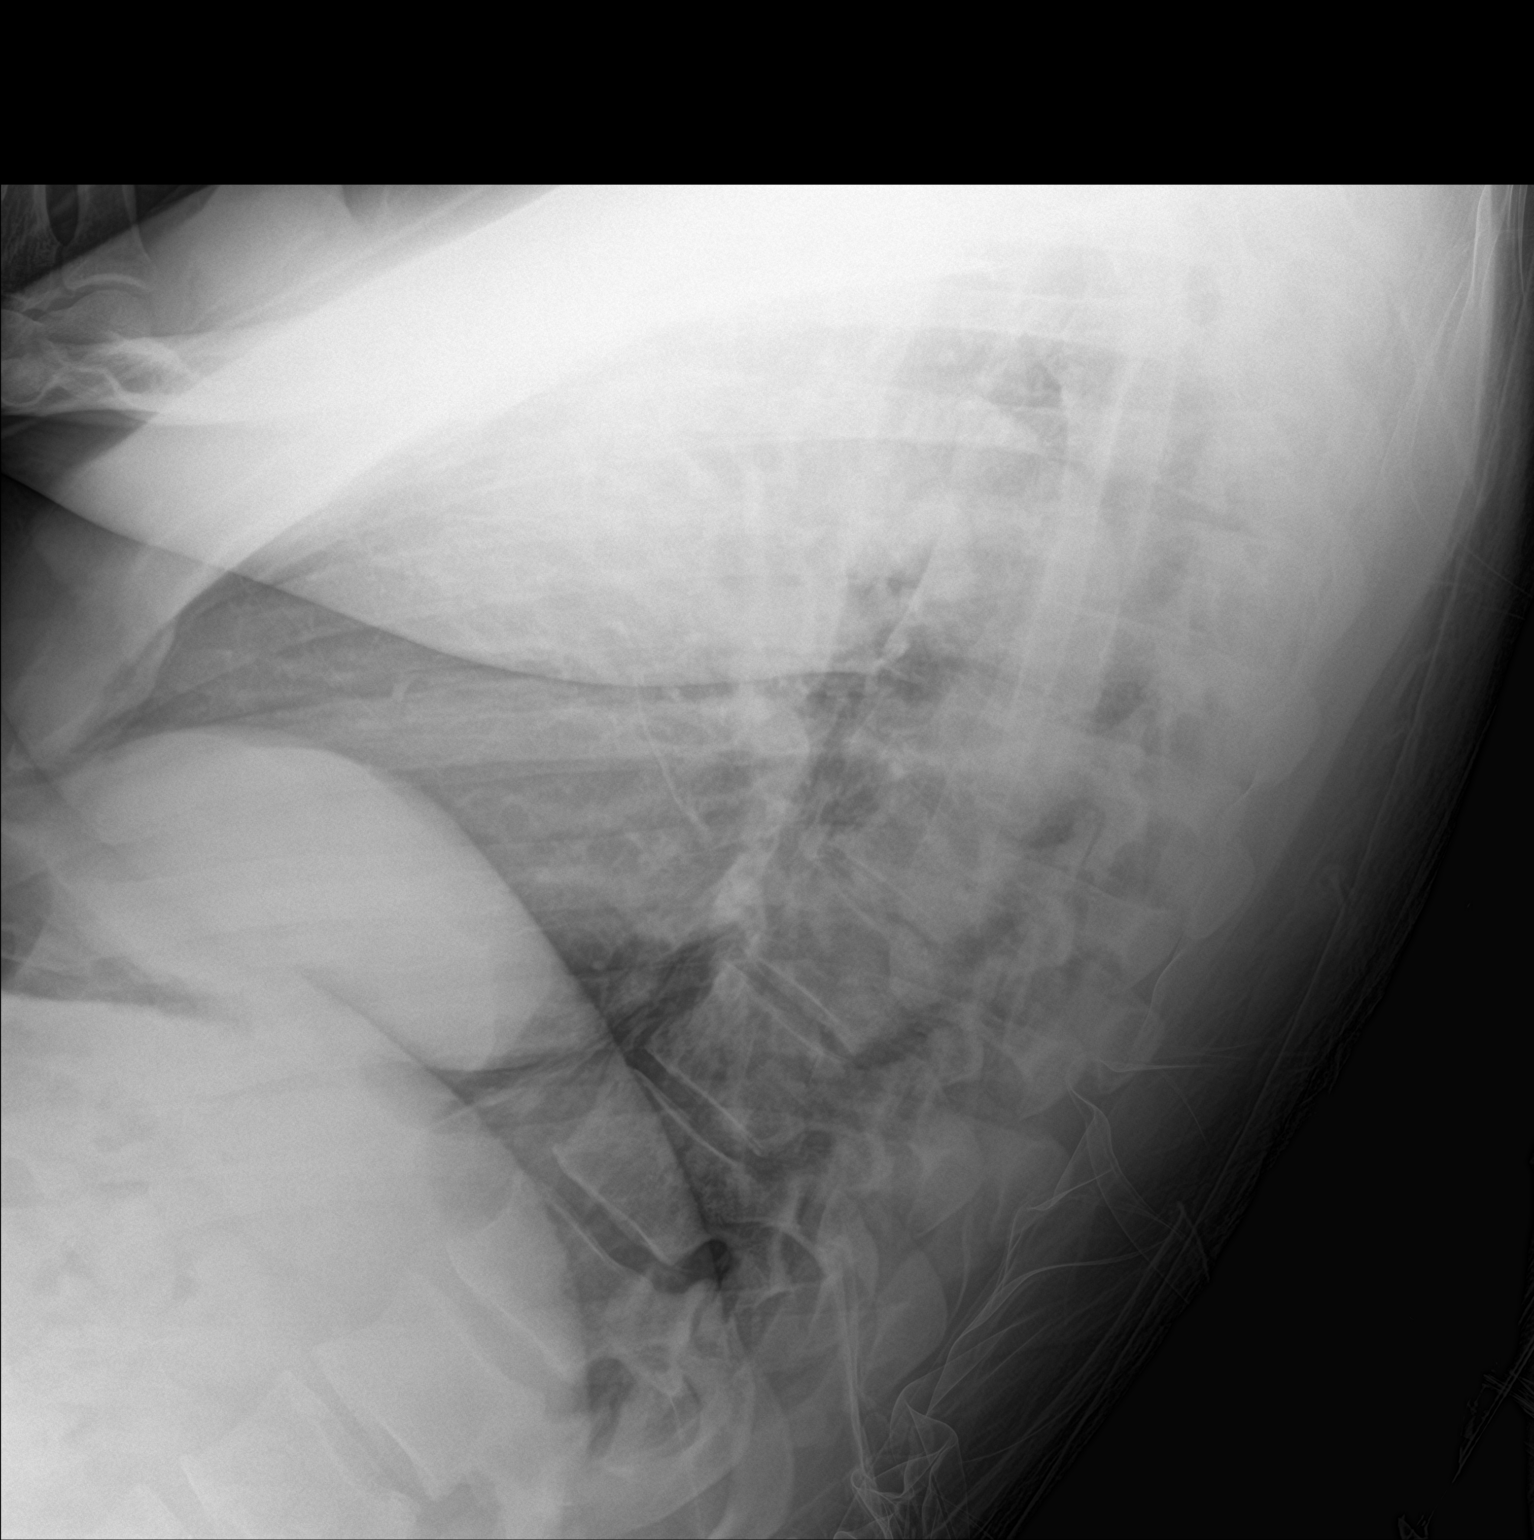

[chest ap]
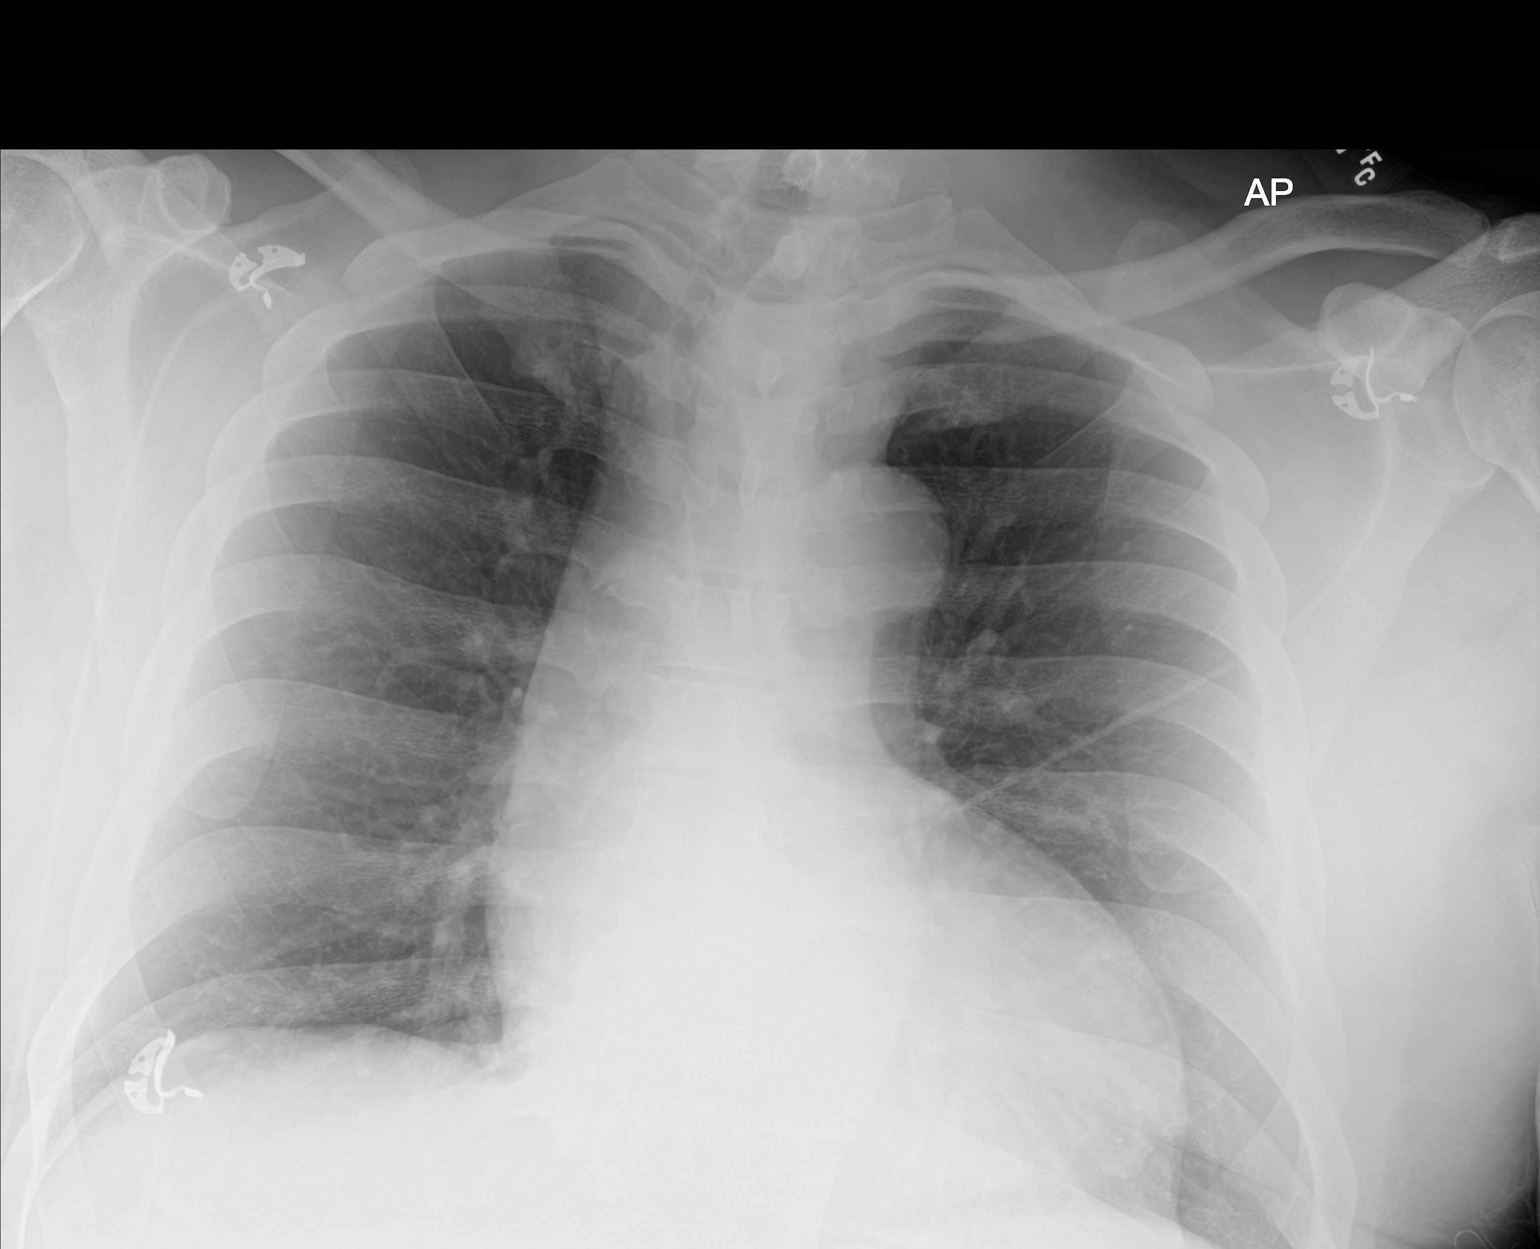

[2 of 2 positions shown; findings below may reference images not displayed]

FINDINGS: No acute consolidation or pleural effusion. Borderline cardiomegaly.
No pneumothorax.
IMPRESSION: No active cardiopulmonary disease.
